# Patient Record
Sex: Male | Born: 1965
Health system: Southern US, Community
[De-identification: ages and names within clinical notes are randomized; demographics above are authoritative.]

## PROBLEM LIST (undated history)

## (undated) DIAGNOSIS — F909 Attention-deficit hyperactivity disorder, unspecified type: Secondary | ICD-10-CM

## (undated) DIAGNOSIS — IMO0001 Reserved for inherently not codable concepts without codable children: Secondary | ICD-10-CM

## (undated) DIAGNOSIS — E669 Obesity, unspecified: Secondary | ICD-10-CM

## (undated) DIAGNOSIS — E782 Mixed hyperlipidemia: Secondary | ICD-10-CM

## (undated) DIAGNOSIS — E1169 Type 2 diabetes mellitus with other specified complication: Secondary | ICD-10-CM

## (undated) DIAGNOSIS — R030 Elevated blood-pressure reading, without diagnosis of hypertension: Secondary | ICD-10-CM

## (undated) HISTORY — DX: Attention-deficit hyperactivity disorder, unspecified type: F90.9

## (undated) HISTORY — DX: Elevated blood-pressure reading, without diagnosis of hypertension: R03.0

## (undated) HISTORY — DX: Mixed hyperlipidemia: E78.2

## (undated) HISTORY — DX: Reserved for inherently not codable concepts without codable children: IMO0001

## (undated) HISTORY — DX: Obesity, unspecified: E11.69

## (undated) HISTORY — DX: Type 2 diabetes mellitus with other specified complication: E66.9

---

## 2000-07-18 ENCOUNTER — Emergency Department (HOSPITAL_COMMUNITY): Admission: EM | Admit: 2000-07-18 | Discharge: 2000-07-18 | Payer: Self-pay | Admitting: *Deleted

## 2000-07-18 ENCOUNTER — Encounter: Payer: Self-pay | Admitting: Emergency Medicine

## 2002-12-20 ENCOUNTER — Emergency Department (HOSPITAL_COMMUNITY): Admission: EM | Admit: 2002-12-20 | Discharge: 2002-12-21 | Payer: Self-pay | Admitting: Emergency Medicine

## 2002-12-21 ENCOUNTER — Encounter: Payer: Self-pay | Admitting: Emergency Medicine

## 2004-12-01 ENCOUNTER — Emergency Department (HOSPITAL_COMMUNITY): Admission: EM | Admit: 2004-12-01 | Discharge: 2004-12-01 | Payer: Self-pay | Admitting: Emergency Medicine

## 2010-10-09 ENCOUNTER — Emergency Department (HOSPITAL_BASED_OUTPATIENT_CLINIC_OR_DEPARTMENT_OTHER)
Admission: EM | Admit: 2010-10-09 | Discharge: 2010-10-09 | Disposition: A | Discharge status: Home / Self Care | Payer: Worker's Compensation | Attending: Emergency Medicine | Admitting: Emergency Medicine

## 2010-10-09 DIAGNOSIS — N509 Disorder of male genital organs, unspecified: Secondary | ICD-10-CM | POA: Insufficient documentation

## 2010-10-09 LAB — URINE MICROSCOPIC-ADD ON

## 2010-10-09 LAB — URINALYSIS, ROUTINE W REFLEX MICROSCOPIC
Bilirubin Urine: NEGATIVE
Glucose, UA: NEGATIVE mg/dL
Ketones, ur: NEGATIVE mg/dL
Leukocytes, UA: NEGATIVE
Nitrite: NEGATIVE
Protein, ur: NEGATIVE mg/dL
Specific Gravity, Urine: 1.022 (ref 1.005–1.030)
Urobilinogen, UA: 1 mg/dL (ref 0.0–1.0)
pH: 6 (ref 5.0–8.0)

## 2010-10-10 ENCOUNTER — Other Ambulatory Visit: Payer: Self-pay | Admitting: Surgery

## 2010-10-10 ENCOUNTER — Ambulatory Visit
Admission: RE | Admit: 2010-10-10 | Discharge: 2010-10-10 | Disposition: A | Discharge status: Home / Self Care | Payer: Worker's Compensation | Source: Ambulatory Visit | Attending: Surgery | Admitting: Surgery

## 2010-10-10 DIAGNOSIS — N50819 Testicular pain, unspecified: Secondary | ICD-10-CM

## 2011-08-21 ENCOUNTER — Ambulatory Visit
Admission: RE | Admit: 2011-08-21 | Discharge: 2011-08-21 | Disposition: A | Discharge status: Home / Self Care | Payer: 59 | Source: Ambulatory Visit | Attending: Family Medicine | Admitting: Family Medicine

## 2011-08-21 ENCOUNTER — Other Ambulatory Visit: Payer: Self-pay | Admitting: Family Medicine

## 2011-08-21 DIAGNOSIS — R1031 Right lower quadrant pain: Secondary | ICD-10-CM

## 2011-08-21 MED ORDER — IOHEXOL 300 MG/ML  SOLN
125.0000 mL | Freq: Once | INTRAMUSCULAR | Status: AC | PRN
  Administered 2011-08-21: 125 mL via INTRAVENOUS

## 2011-08-21 MED ORDER — IOHEXOL 300 MG/ML  SOLN
30.0000 mL | Freq: Once | INTRAMUSCULAR | Status: AC | PRN
  Administered 2011-08-21: 30 mL via ORAL

## 2012-11-16 IMAGING — CT CT ABD-PELV W/ CM
2 of 5 series · 13 of 32 positions shown, 18 images · IV contrast (omnipaque)
Comparison: None.

CLINICAL DATA: Severe right lower quadrant pain.

CT ABDOMEN AND PELVIS WITH CONTRAST
TECHNIQUE: Multidetector CT imaging of the abdomen and pelvis was
performed following the standard protocol during bolus
administration of intravenous contrast.
Contrast: 125mL OMNIPAQUE IOHEXOL 300 MG/ML IV SOLN, 30mL OMNIPAQUE
IOHEXOL 300 MG/ML IV SOLN

[Series 2: abd/pelvis with · axial · 0.86mm/px · z∈[-430,-85]mm · 5 of 105 slices shown, 10 images]
[im 18/105  soft-tissue]
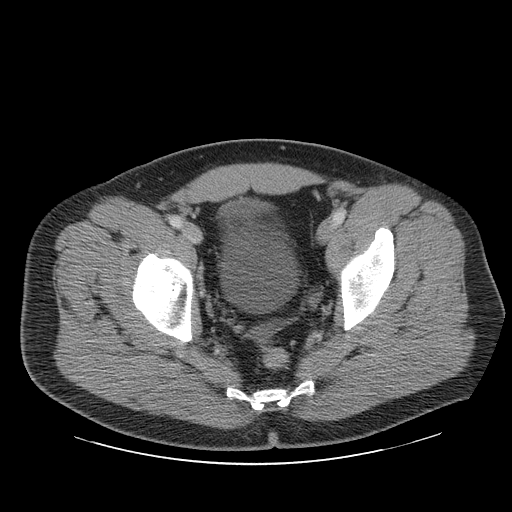
[im 18/105  bone]
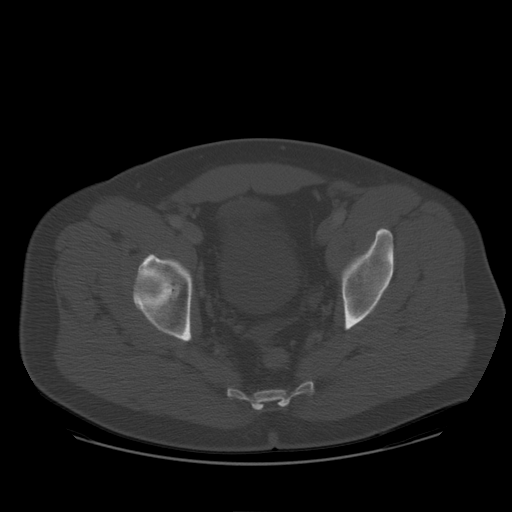
[im 35/105  soft-tissue]
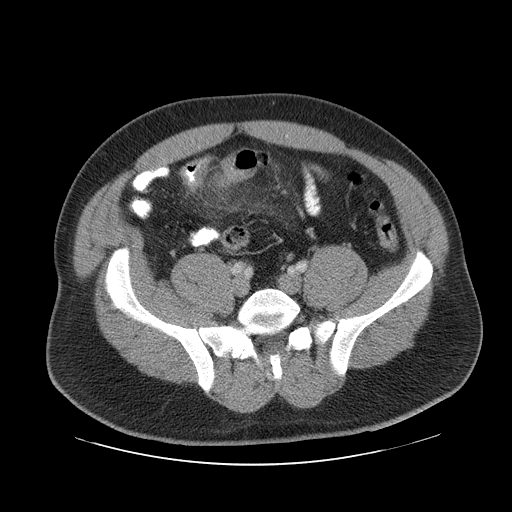
[im 35/105  lung]
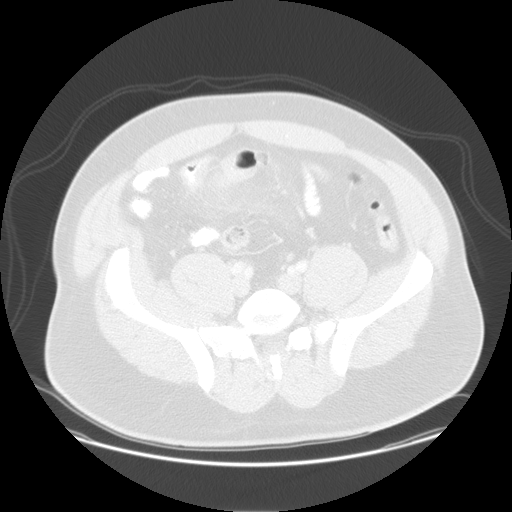
[im 53/105  soft-tissue]
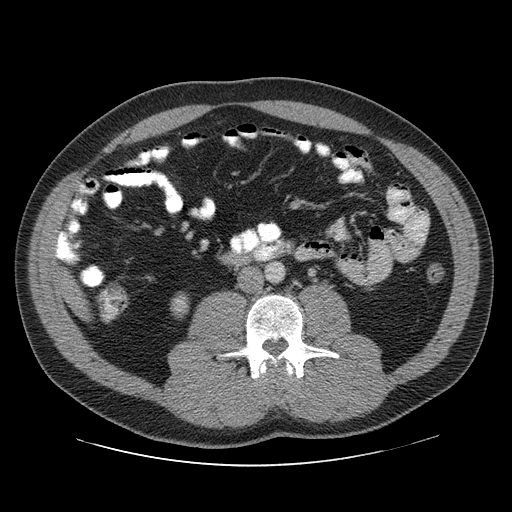
[im 53/105  lung]
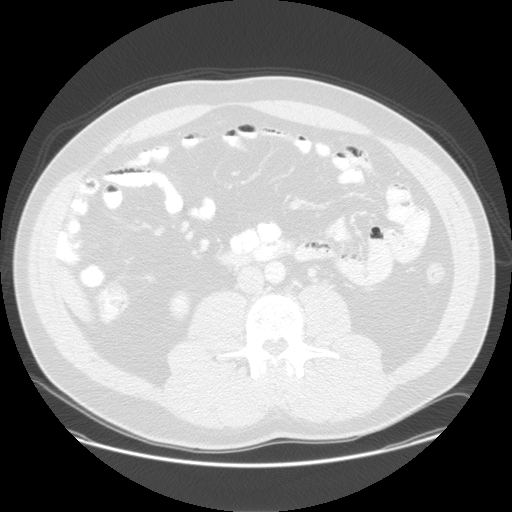
[im 70/105  soft-tissue]
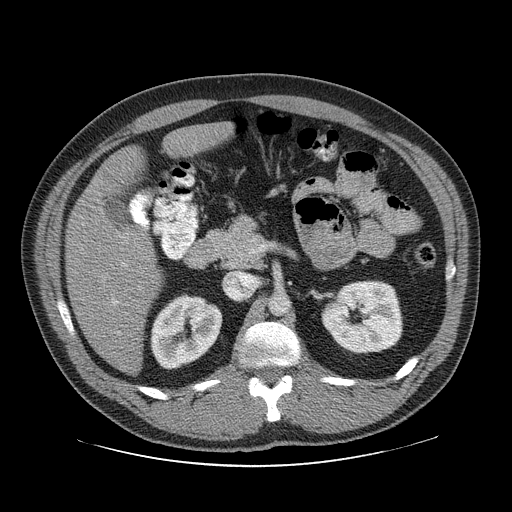
[im 70/105  lung]
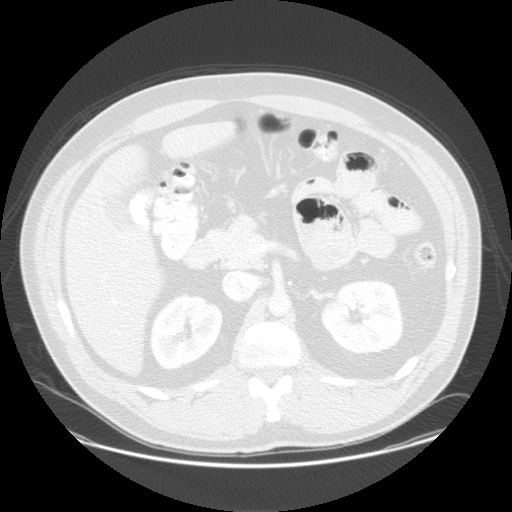
[im 87/105  soft-tissue]
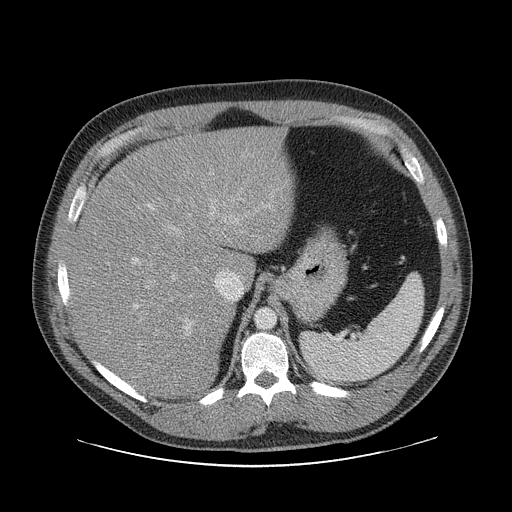
[im 87/105  lung]
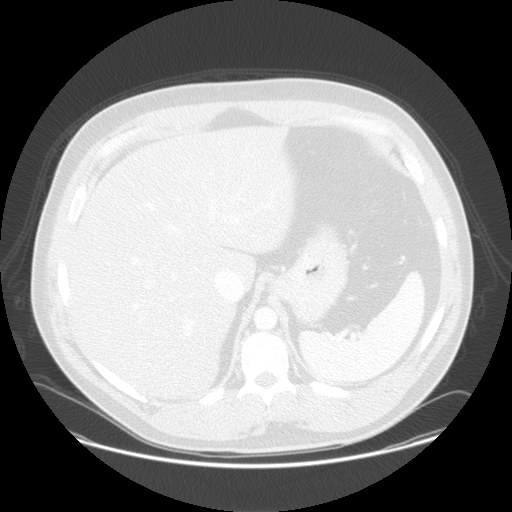

[Series 400: sag · sagittal · 1.09mm/px · 8 of 168 slices shown]
[im 16/168  soft-tissue]
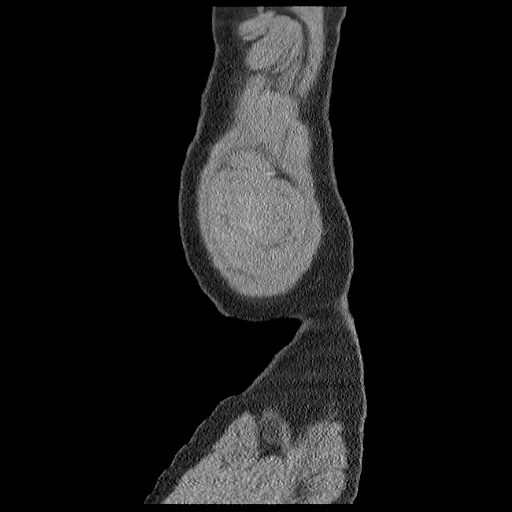
[im 31/168  soft-tissue]
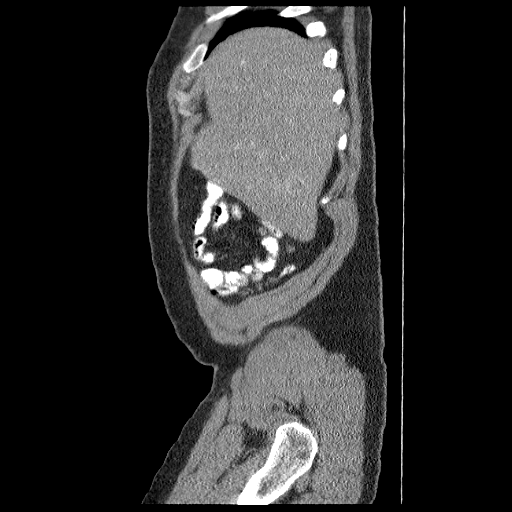
[im 61/168  soft-tissue]
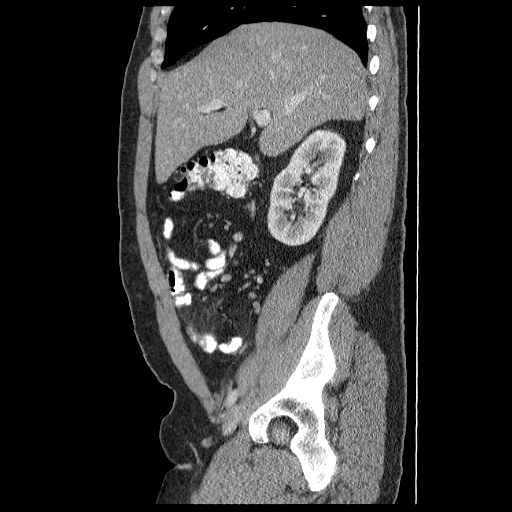
[im 76/168  soft-tissue]
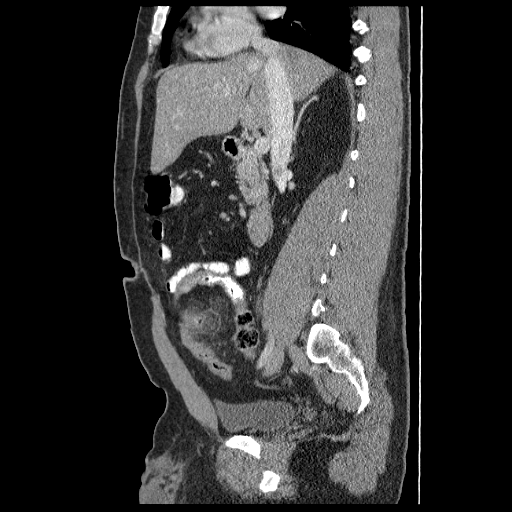
[im 92/168  soft-tissue]
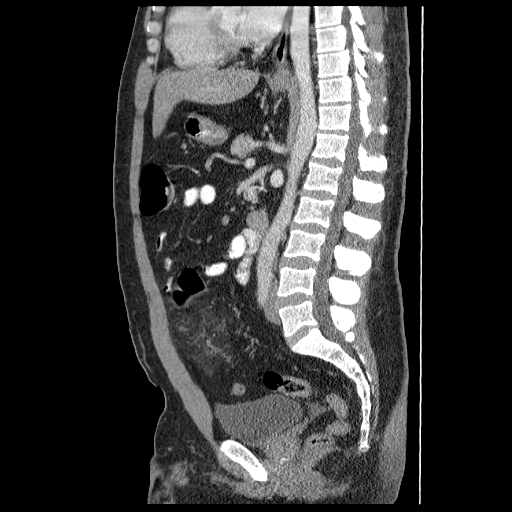
[im 107/168  soft-tissue]
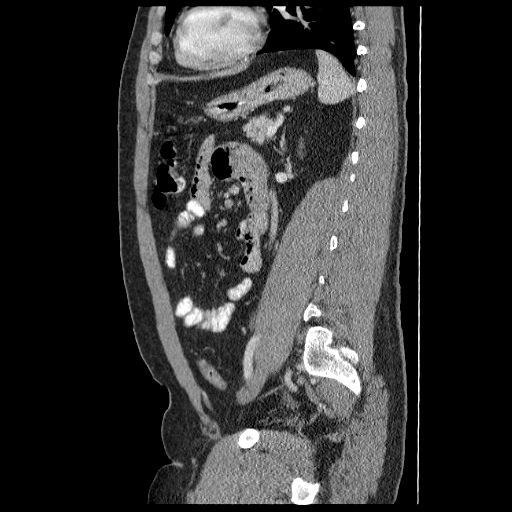
[im 137/168  soft-tissue]
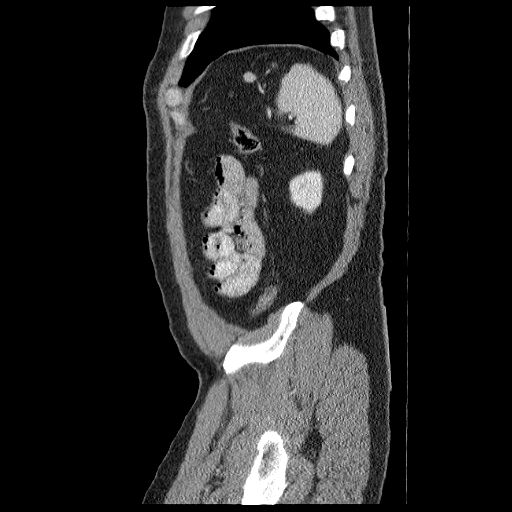
[im 152/168  soft-tissue]
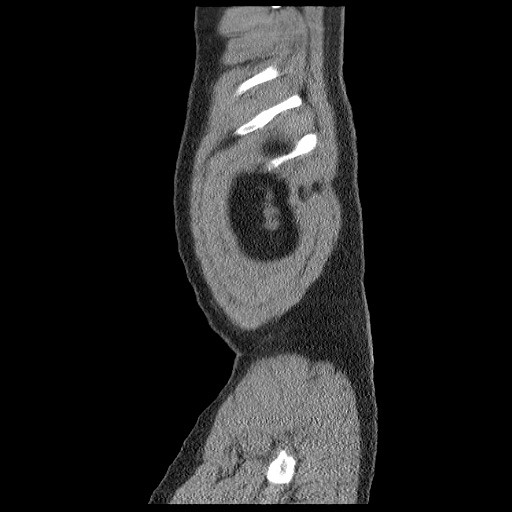

[13 of 32 positions shown; findings below may reference images not displayed]

FINDINGS: The liver is diffusely fatty infiltrated and measures
26.8 cm in cranial caudal length, enlarged.  7 mm hyperattenuating
lesion is seen in the upper right liver, too small to characterize.
Spleen is unremarkable.  The stomach, duodenum, pancreas,
gallbladder, adrenal glands, and right kidney have normal imaging
features. Tiny low density lesion in the left kidney is too small
to characterize but likely represents a cortical cyst.

No abdominal aortic aneurysm.  There is no free fluid or
lymphadenopathy in the abdomen.

Imaging through the pelvis shows a trace amount of intraperitoneal
free fluid.  There is no pelvic sidewall lymphadenopathy.  Bladder
is unremarkable.

The sigmoid colon is elongated and somewhat redundant pattern.
There is an area of edema and inflammation around the mid sigmoid
colon which is positioned in the anterior abdomen near the midline
just below the level of the umbilicus.  Imaging features are
consistent with diverticulitis without evidence for perforation or
abscess.

The terminal ileum is normal.  The appendix is normal.

Bone windows reveal no worrisome lytic or sclerotic osseous
lesions.
IMPRESSION: Diverticulitis of the mid sigmoid colon.  No perforation or abscess
at this time.

## 2013-12-29 ENCOUNTER — Ambulatory Visit: Payer: Self-pay | Admitting: Podiatrist

## 2014-04-13 LAB — PULMONARY FUNCTION TEST

## 2014-05-24 ENCOUNTER — Telehealth: Payer: Self-pay | Admitting: Cardiology

## 2014-05-24 NOTE — Telephone Encounter (Signed)
Records from [facility] received for appointment on 01.04.16 with P [place]: given to [place] in HIM department:djc

## 2014-07-23 ENCOUNTER — Ambulatory Visit (INDEPENDENT_AMBULATORY_CARE_PROVIDER_SITE_OTHER): Payer: 59 | Admitting: Cardiology

## 2014-07-23 ENCOUNTER — Encounter: Payer: Self-pay | Admitting: Cardiology

## 2014-07-23 VITALS — BP 160/82 | HR 84 | Ht 73.0 in | Wt 251.0 lb

## 2014-07-23 DIAGNOSIS — I10 Essential (primary) hypertension: Secondary | ICD-10-CM

## 2014-07-23 DIAGNOSIS — E782 Mixed hyperlipidemia: Secondary | ICD-10-CM | POA: Insufficient documentation

## 2014-07-23 DIAGNOSIS — E119 Type 2 diabetes mellitus without complications: Secondary | ICD-10-CM

## 2014-07-23 DIAGNOSIS — E669 Obesity, unspecified: Secondary | ICD-10-CM

## 2014-07-23 DIAGNOSIS — Z8249 Family history of ischemic heart disease and other diseases of the circulatory system: Secondary | ICD-10-CM | POA: Insufficient documentation

## 2014-07-23 DIAGNOSIS — E1169 Type 2 diabetes mellitus with other specified complication: Secondary | ICD-10-CM | POA: Insufficient documentation

## 2014-07-23 DIAGNOSIS — R9431 Abnormal electrocardiogram [ECG] [EKG]: Secondary | ICD-10-CM

## 2014-07-23 NOTE — Progress Notes (Signed)
Sriansh Farra Shreffler Date of Birth: [birth year] Medical Record #[id number]  History of Present Illness: Eric Young is seen at the request of Dr. Tiburcio Pea for cardiac evaluation. He is a pleasant 49 yo WM with multiple cardiac risk factors. He has DM type 2, elevated BP, Hyperlipidemia, and family history of premature CAD. He works as a Company secretary. Due to his DM cardiac evaluation is required to demonstrate no ischemia at a work load of 12 mets and to assess cardiac function. He has been trying to lose weight. He denies any current chest pain, SOB, dizziness or edema. He tries to stay active. He does report his BP readings have been running a little high at work- typically in the 140s systolic.     Medication List       This list is accurate as of: 07/23/14  9:07 PM.  Always use your most recent med list.               ADDERALL XR 20 MG 24 hr capsule  Generic drug:  amphetamine-dextroamphetamine  Take 40 mg by mouth daily. Take 2 tablets daily     INVOKANA 300 MG Tabs tablet  Generic drug:  canagliflozin  Take 300 mg by mouth daily.     metFORMIN 500 MG 24 hr tablet  Commonly known as:  GLUCOPHAGE-XR  Take 500 mg by mouth daily. Take 4 tablets daily         Past Medical History  Diagnosis Date  . Diabetes mellitus type 2 in obese   . ADHD (attention deficit hyperactivity disorder)   . Combined hyperlipidemia   . Elevated BP     History reviewed. No pertinent past surgical history.  History   Social History  . Marital Status: Married    Spouse Name: N/A    Number of Children: 3  . Years of Education: N/A   Occupational History  . fireman    Social History Main Topics  . Smoking status: Never Smoker   . Smokeless tobacco: None  . Alcohol Use: No  . Drug Use: No  . Sexual Activity: None   Other Topics Concern  . None   Social History Narrative    Family History  Problem Relation Age of Onset  . Hyperlipidemia Mother   . Heart attack Father   . Heart disease  Father   . Hypertension Father     Review of Systems: As noted in HPI.  All other systems were reviewed and are negative.  Physical Exam: BP 160/82 mmHg  Pulse 84  Ht  (1.854 m)  Wt 251 lb (113.853 kg)  BMI 33.12 kg/m2 Filed Weights   07/23/14 1009  Weight: 251 lb (113.853 kg)  GENERAL:  Well appearing obese WM in NAD. HEENT:  PERRL, EOMI, sclera are clear. Oropharynx is clear. NECK:  No jugular venous distention, carotid upstroke brisk and symmetric, no bruits, no thyromegaly or adenopathy LUNGS:  Clear to auscultation bilaterally CHEST:  Unremarkable HEART:  RRR,  PMI not displaced or sustained,S1 and S2 within normal limits, no S3, no S4: no clicks, no rubs, no murmurs ABD:  Soft, nontender. BS +, no masses or bruits. No hepatomegaly, no splenomegaly EXT:  2 + pulses throughout, no edema, no cyanosis no clubbing SKIN:  Warm and dry.  No rashes NEURO:  Alert and oriented x 3. Cranial nerves II through XII intact. PSYCH:  Cognitively intact    LABORATORY DATA: Ecg today: NSR with rate 84. LAFB. I have personally reviewed  and interpreted this study. Labs dated 03/31/14: normal CBC. Glucose 188. ALT 53. Other chemistries normal. Cholesterol 188, triglycerides 692. HDL 23. PSA and TSH normal.   Assessment / Plan: 1. DM type 2. On metformin. Reports last A1c 6.3%. Per Dr. Tiburcio Pea.  2. Elevated BP- with his diabetes goal BP less than 130/80. Monitor with lifestyle modifications but if still elevated would start antihypertensive therapy preferably with ACEi or ARB.  3. Dyslipidemia. Prior elevated triglycerides with poor diabetes control. Would reassess once glycemic control better. Add fish oil 2-4 grams per day. I would have a low threshold for starting statin therapy  4. Family history of premature CAD  5. LAFB  6. Obesity. Discussed importance of dietary modification with low carbohydrates particulaly avoiding sweets and sodas. Reducing white foods such as rice, pasta,  potatoes, and bread. Eat more complex plant foods and avoid processed foods. Increase aerobic activity.  Per NFPA guidelines will schedule for stress Echo to rule out ischemia and assess LV function.

## 2014-07-23 NOTE — Patient Instructions (Signed)
Goal blood pressure is less than 130/80 with your diabetes  Work on weight loss with carbohydrate modified diet- reducing simple carbohydrates (rice,pasta,breads,potatoes) and sweets. Eat more vegetables.   Consider taking 2-4 grams of fish oil daily  We will schedule you for a stress Echo.

## 2014-07-26 ENCOUNTER — Telehealth: Payer: Self-pay

## 2014-07-26 NOTE — Telephone Encounter (Signed)
Dr.Jordan's 07/23/14 office note faxed to [facility] Dr.Stephen Williams [place] at fax # [phone].

## 2014-07-27 ENCOUNTER — Ambulatory Visit (HOSPITAL_COMMUNITY): Payer: 59 | Attending: Cardiovascular Disease | Admitting: Cardiology

## 2014-07-27 ENCOUNTER — Ambulatory Visit (HOSPITAL_COMMUNITY): Payer: 59

## 2014-07-27 DIAGNOSIS — E669 Obesity, unspecified: Secondary | ICD-10-CM

## 2014-07-27 DIAGNOSIS — R9431 Abnormal electrocardiogram [ECG] [EKG]: Secondary | ICD-10-CM | POA: Insufficient documentation

## 2014-07-27 DIAGNOSIS — E1169 Type 2 diabetes mellitus with other specified complication: Secondary | ICD-10-CM

## 2014-07-27 DIAGNOSIS — I10 Essential (primary) hypertension: Secondary | ICD-10-CM | POA: Diagnosis not present

## 2014-07-27 DIAGNOSIS — Z8249 Family history of ischemic heart disease and other diseases of the circulatory system: Secondary | ICD-10-CM | POA: Diagnosis not present

## 2014-07-27 DIAGNOSIS — E782 Mixed hyperlipidemia: Secondary | ICD-10-CM

## 2014-07-27 NOTE — Progress Notes (Signed)
Stress echo performed. 

## 2014-07-30 ENCOUNTER — Telehealth: Payer: Self-pay

## 2014-07-30 NOTE — Telephone Encounter (Signed)
Spoke to patient he stated he needed a letter stating stress echo normal.Letter mailed 07/30/14.

## 2015-10-11 ENCOUNTER — Ambulatory Visit
Admission: RE | Admit: 2015-10-11 | Discharge: 2015-10-11 | Disposition: A | Discharge status: Home / Self Care | Payer: Commercial Managed Care - HMO | Source: Ambulatory Visit | Attending: Family Medicine | Admitting: Family Medicine

## 2015-10-11 ENCOUNTER — Other Ambulatory Visit: Payer: Self-pay | Admitting: Family Medicine

## 2015-10-11 DIAGNOSIS — M545 Low back pain, unspecified: Secondary | ICD-10-CM

## 2016-08-04 DIAGNOSIS — E119 Type 2 diabetes mellitus without complications: Secondary | ICD-10-CM | POA: Diagnosis not present

## 2016-08-04 DIAGNOSIS — H33199 Other retinoschisis and retinal cysts, unspecified eye: Secondary | ICD-10-CM | POA: Diagnosis not present

## 2016-08-04 DIAGNOSIS — H2513 Age-related nuclear cataract, bilateral: Secondary | ICD-10-CM | POA: Diagnosis not present

## 2016-08-31 DIAGNOSIS — E782 Mixed hyperlipidemia: Secondary | ICD-10-CM | POA: Diagnosis not present

## 2016-08-31 DIAGNOSIS — E781 Pure hyperglyceridemia: Secondary | ICD-10-CM | POA: Diagnosis not present

## 2016-11-03 DIAGNOSIS — E782 Mixed hyperlipidemia: Secondary | ICD-10-CM | POA: Diagnosis not present

## 2016-11-03 DIAGNOSIS — E119 Type 2 diabetes mellitus without complications: Secondary | ICD-10-CM | POA: Diagnosis not present

## 2017-01-06 IMAGING — CT CT ABD-PELV W/O CM
1 of 2 series · 15 of 32 positions shown, 19 images · non-contrast
Comparison: CT abdomen pelvis 08/21/2011

CLINICAL DATA: Right-sided low back pain without sciatica. Micro
hematuria. Rule out kidney stone

EXAM:
CT ABDOMEN AND PELVIS WITHOUT CONTRAST
TECHNIQUE: Multidetector CT imaging of the abdomen and pelvis was performed
following the standard protocol without IV contrast.

[Series 2: abd/pelvis w/(date) · axial · 0.84mm/px · z∈[-516,+14]mm · 15 of 116 slices shown, 19 images]
[im 5/116  soft-tissue]
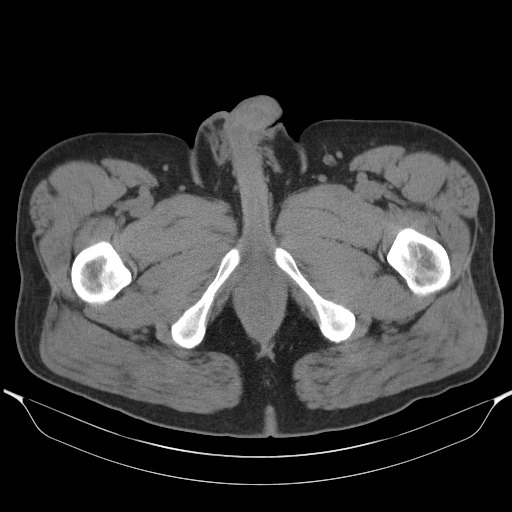
[im 5/116  bone]
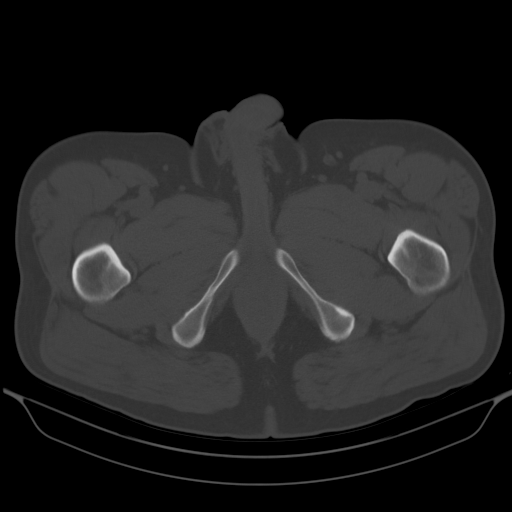
[im 14/116  soft-tissue]
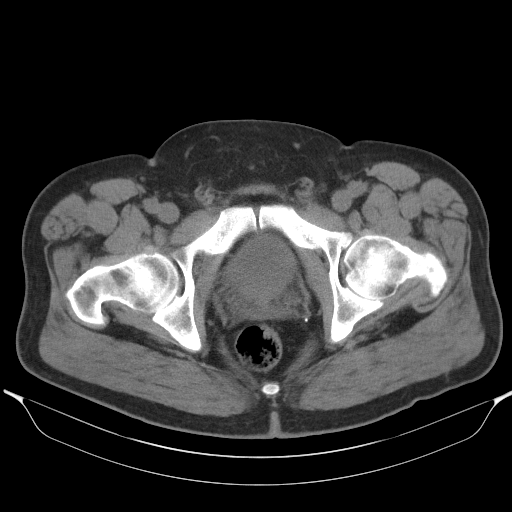
[im 23/116  soft-tissue]
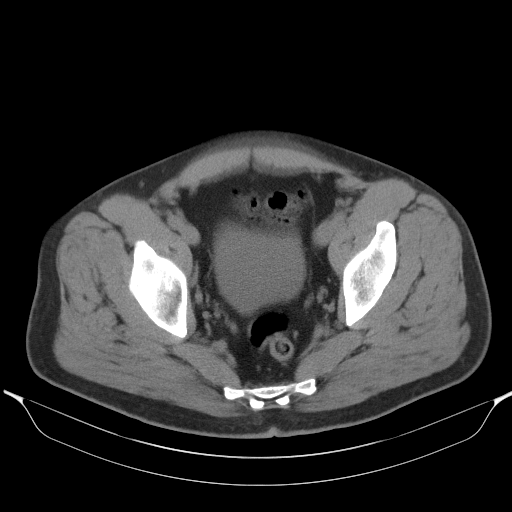
[im 31/116  soft-tissue]
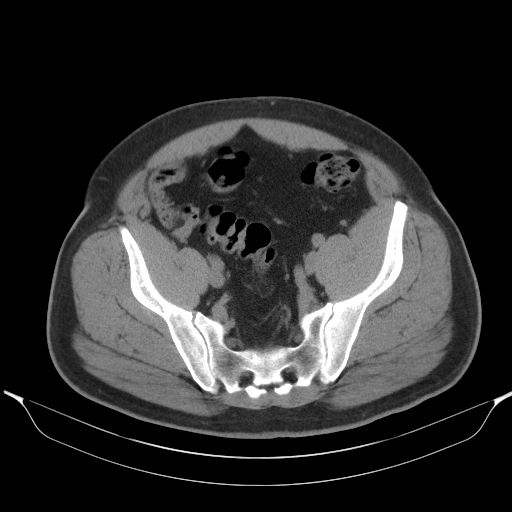
[im 40/116  soft-tissue]
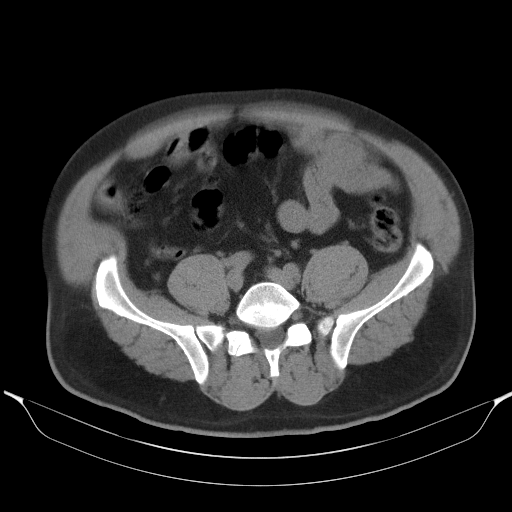
[im 49/116  soft-tissue]
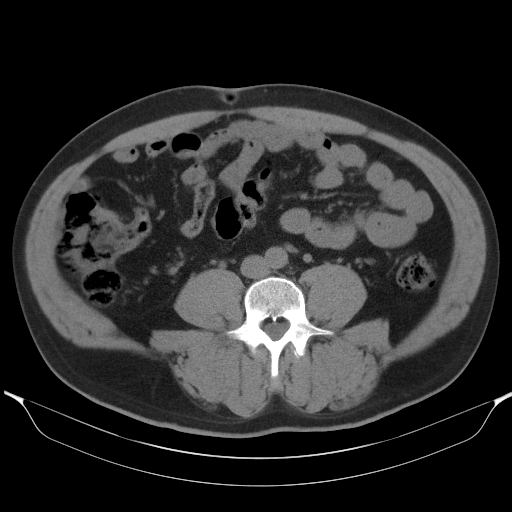
[im 58/116  soft-tissue]
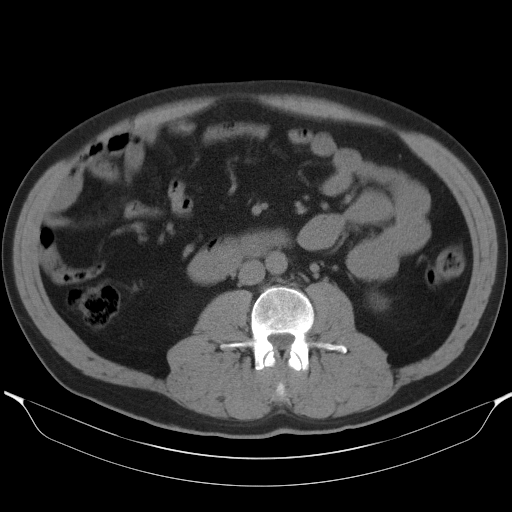
[im 67/116  soft-tissue]
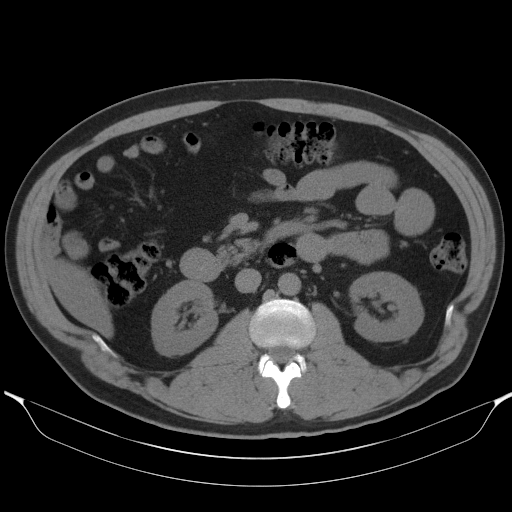
[im 76/116  soft-tissue]
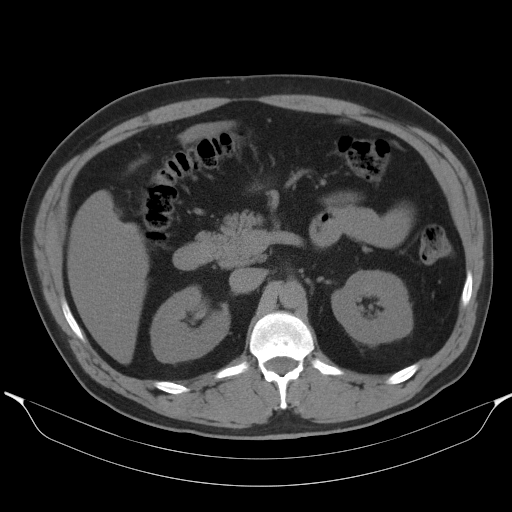
[im 76/116  bone]
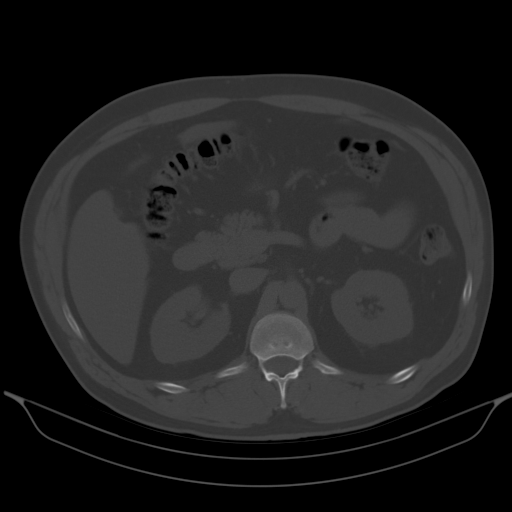
[im 85/116  soft-tissue]
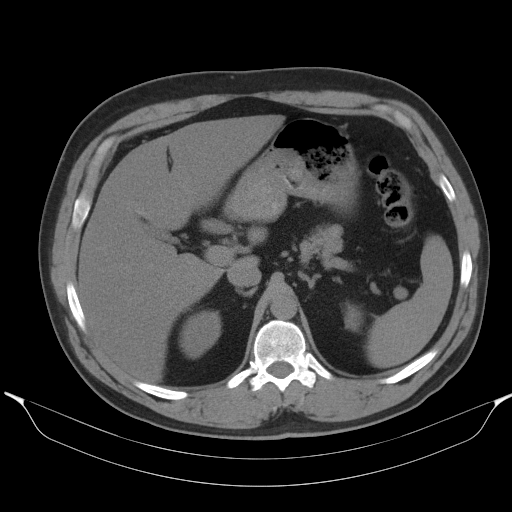
[im 93/116  soft-tissue]
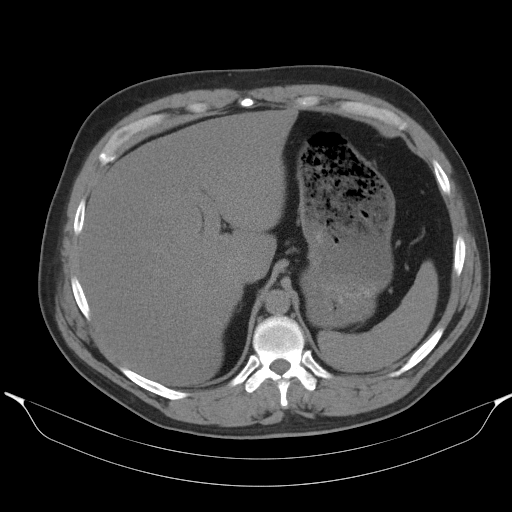
[im 98/116  lung]
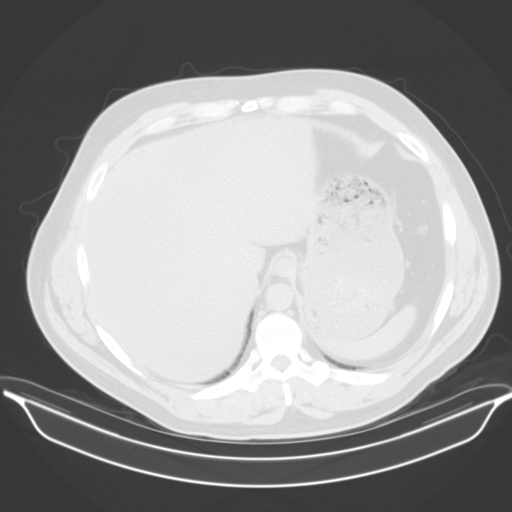
[im 102/116  soft-tissue]
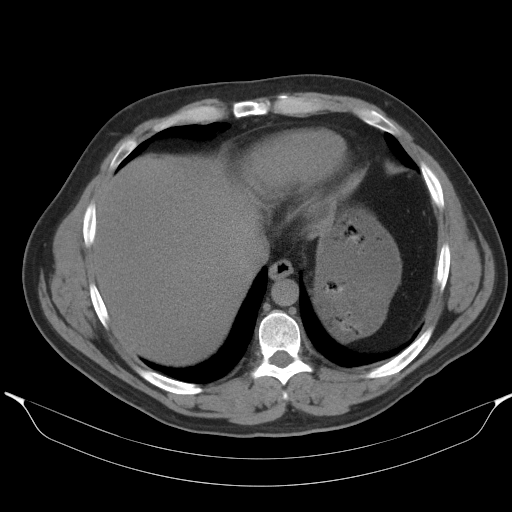
[im 102/116  lung]
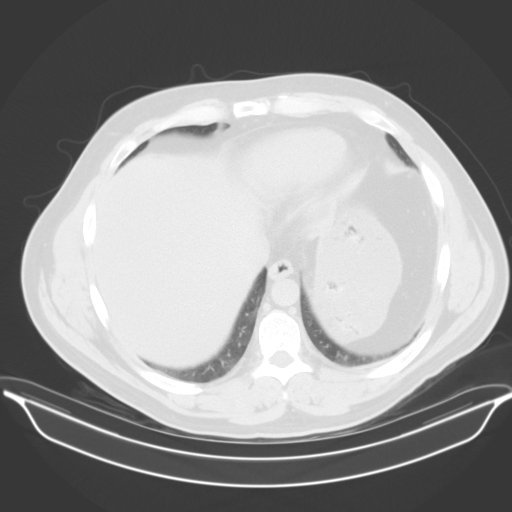
[im 107/116  lung]
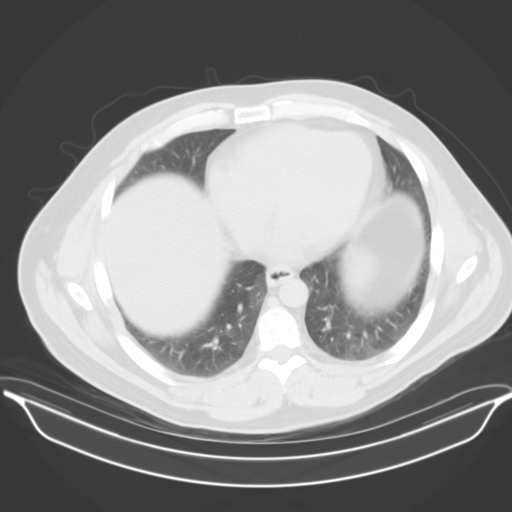
[im 111/116  soft-tissue]
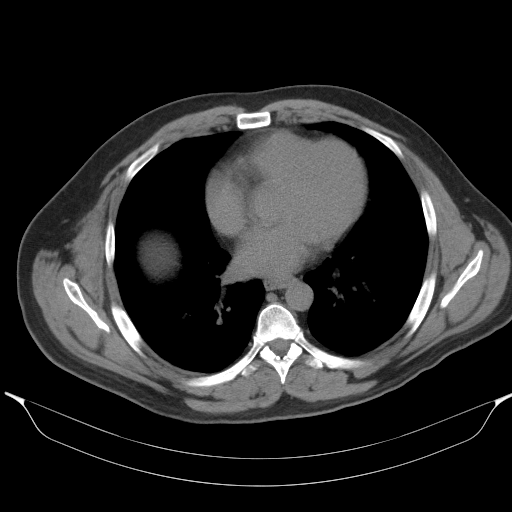
[im 111/116  lung]
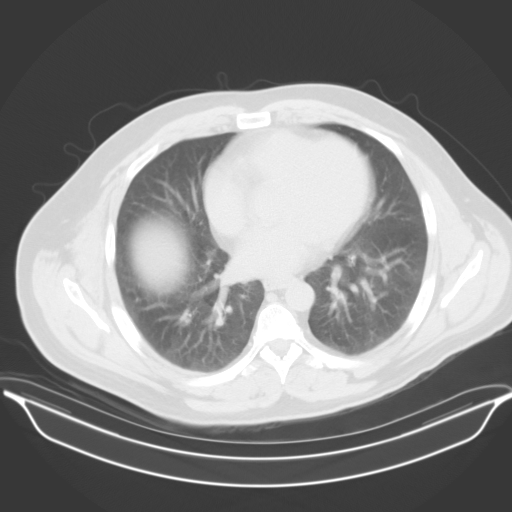

[15 of 32 positions shown; findings below may reference images not displayed]

FINDINGS: Lower chest:  Lung bases clear without infiltrate or effusion

Hepatobiliary: Fatty infiltration of the liver. No focal liver
lesion. Gallbladder and bile ducts normal.

Pancreas: Negative

Spleen: Spleen is normal in size. Scattered small soft tissue
nodules adjacent to the spleen are unchanged and most compatible
with splenules.

Adrenals/Urinary Tract: Negative for renal mass or obstruction. No
urinary tract calculi. Ureters are normal and the bladder is normal.

Stomach/Bowel: Negative for bowel obstruction. No bowel edema.
Sigmoid diverticulosis without diverticulitis. Normal appendix. Note
is made of diverticulitis of the sigmoid colon on the prior CT 5394

Vascular/Lymphatic: Negative for aortic aneurysm. No significant
atherosclerotic disease. No lymphadenopathy.

Reproductive: Moderate prostate enlargement with prostate
calcification.

Other: No free-fluid

Musculoskeletal: No significant skeletal abnormality.
IMPRESSION: No acute abnormality.  No urinary tract calculi

Sigmoid diverticulosis without diverticulitis

Normal appendix

## 2017-02-01 DIAGNOSIS — E119 Type 2 diabetes mellitus without complications: Secondary | ICD-10-CM | POA: Diagnosis not present

## 2017-02-01 DIAGNOSIS — E782 Mixed hyperlipidemia: Secondary | ICD-10-CM | POA: Diagnosis not present

## 2017-05-04 DIAGNOSIS — Z23 Encounter for immunization: Secondary | ICD-10-CM | POA: Diagnosis not present

## 2017-05-04 DIAGNOSIS — E119 Type 2 diabetes mellitus without complications: Secondary | ICD-10-CM | POA: Diagnosis not present

## 2017-05-04 DIAGNOSIS — E782 Mixed hyperlipidemia: Secondary | ICD-10-CM | POA: Diagnosis not present

## 2017-06-22 DIAGNOSIS — T63301A Toxic effect of unspecified spider venom, accidental (unintentional), initial encounter: Secondary | ICD-10-CM | POA: Diagnosis not present

## 2017-08-06 DIAGNOSIS — H33199 Other retinoschisis and retinal cysts, unspecified eye: Secondary | ICD-10-CM | POA: Diagnosis not present

## 2017-08-06 DIAGNOSIS — E119 Type 2 diabetes mellitus without complications: Secondary | ICD-10-CM | POA: Diagnosis not present

## 2017-08-06 DIAGNOSIS — H2513 Age-related nuclear cataract, bilateral: Secondary | ICD-10-CM | POA: Diagnosis not present

## 2017-10-28 DIAGNOSIS — E119 Type 2 diabetes mellitus without complications: Secondary | ICD-10-CM | POA: Diagnosis not present

## 2017-10-28 DIAGNOSIS — E782 Mixed hyperlipidemia: Secondary | ICD-10-CM | POA: Diagnosis not present

## 2018-05-04 DIAGNOSIS — E782 Mixed hyperlipidemia: Secondary | ICD-10-CM | POA: Diagnosis not present

## 2018-05-04 DIAGNOSIS — E119 Type 2 diabetes mellitus without complications: Secondary | ICD-10-CM | POA: Diagnosis not present

## 2018-05-04 DIAGNOSIS — Z23 Encounter for immunization: Secondary | ICD-10-CM | POA: Diagnosis not present

## 2018-05-31 DIAGNOSIS — H2513 Age-related nuclear cataract, bilateral: Secondary | ICD-10-CM | POA: Diagnosis not present

## 2018-05-31 DIAGNOSIS — E119 Type 2 diabetes mellitus without complications: Secondary | ICD-10-CM | POA: Diagnosis not present

## 2018-05-31 DIAGNOSIS — H33199 Other retinoschisis and retinal cysts, unspecified eye: Secondary | ICD-10-CM | POA: Diagnosis not present

## 2019-10-12 ENCOUNTER — Ambulatory Visit: Payer: 59 | Attending: Internal Medicine

## 2019-10-12 DIAGNOSIS — Z23 Encounter for immunization: Secondary | ICD-10-CM

## 2019-10-12 NOTE — Progress Notes (Signed)
   Covid-19 Vaccination Clinic  Name:  Eric Young    MRN: [id number] DOB: [birth year]  10/12/2019  Eric Young was observed post Covid-19 immunization for 15 minutes without incident. He was provided with Vaccine Information Sheet and instruction to access the V-Safe system.   Eric Young was instructed to call 911 with any severe reactions post vaccine: Marland Kitchen Difficulty breathing  . Swelling of face and throat  . A fast heartbeat  . A bad rash all over body  . Dizziness and weakness   Immunizations Administered    Name Date Dose VIS Date Route   Pfizer COVID-19 Vaccine 10/12/2019 11:58 AM 0.3 mL 06/30/2019 Intramuscular   Manufacturer: [employer], [employer]   Lot: [id number]   NDC: [address]-0

## 2019-11-06 ENCOUNTER — Ambulatory Visit: Payer: 59 | Attending: Internal Medicine

## 2019-11-06 DIAGNOSIS — Z23 Encounter for immunization: Secondary | ICD-10-CM

## 2019-11-06 NOTE — Progress Notes (Signed)
   Covid-19 Vaccination Clinic  Name:  DALESSANDRO BALDYGA    MRN: [id number] DOB: 07/20/1966  11/06/2019  Mr. Maker was observed post Covid-19 immunization for 15 minutes without incident. He was provided with Vaccine Information Sheet and instruction to access the V-Safe system.   Mr. Mohl was instructed to call 911 with any severe reactions post vaccine: Marland Kitchen Difficulty breathing  . Swelling of face and throat  . A fast heartbeat  . A bad rash all over body  . Dizziness and weakness   Immunizations Administered    Name Date Dose VIS Date Route   Pfizer COVID-19 Vaccine 11/06/2019 11:02 AM 0.3 mL 09/13/2018 Intramuscular   Manufacturer: [employer], [employer]   Lot: [id number]   NDC: [address]-1

## 2022-06-01 ENCOUNTER — Other Ambulatory Visit (HOSPITAL_COMMUNITY): Payer: Self-pay

## 2022-06-02 ENCOUNTER — Other Ambulatory Visit (HOSPITAL_COMMUNITY): Payer: Self-pay

## 2022-06-02 MED ORDER — OMEGA-3-ACID ETHYL ESTERS 1 G PO CAPS
2.0000 g | ORAL_CAPSULE | Freq: Two times a day (BID) | ORAL | 0 refills | Status: DC
  Filled 2022-06-02 – 2022-07-31 (×2): qty 360, 90d supply, fill #0

## 2022-06-02 MED ORDER — EMPAGLIFLOZIN 25 MG PO TABS
25.0000 mg | ORAL_TABLET | Freq: Every day | ORAL | 3 refills | Status: DC
  Filled 2022-06-02: qty 30, 30d supply, fill #0
  Filled 2022-06-26 – 2022-06-29 (×2): qty 30, 30d supply, fill #1
  Filled 2022-09-07: qty 30, 30d supply, fill #2
  Filled 2022-09-30 – 2022-11-05 (×2): qty 30, 30d supply, fill #3

## 2022-06-02 MED ORDER — AMPHETAMINE-DEXTROAMPHET ER 30 MG PO CP24
30.0000 mg | ORAL_CAPSULE | Freq: Every morning | ORAL | 0 refills | Status: AC
  Filled 2022-06-02: qty 30, 30d supply, fill #0

## 2022-06-02 MED ORDER — AMPHETAMINE-DEXTROAMPHET ER 30 MG PO CP24: 30.0000 mg | ORAL_CAPSULE | Freq: Every morning | ORAL | 0 refills | Status: DC

## 2022-06-02 MED ORDER — METFORMIN HCL ER 500 MG PO TB24
2000.0000 mg | ORAL_TABLET | Freq: Every day | ORAL | 1 refills | Status: DC
  Filled 2022-06-02: qty 120, 30d supply, fill #0
  Filled 2022-06-26 – 2022-06-29 (×2): qty 120, 30d supply, fill #1

## 2022-06-03 ENCOUNTER — Other Ambulatory Visit (HOSPITAL_COMMUNITY): Payer: Self-pay

## 2022-06-03 MED ORDER — AMPHETAMINE-DEXTROAMPHET ER 30 MG PO CP24
30.0000 mg | ORAL_CAPSULE | Freq: Every morning | ORAL | 0 refills | Status: AC
  Filled 2022-07-03: qty 30, 30d supply, fill #0

## 2022-06-03 MED ORDER — METFORMIN HCL ER 500 MG PO TB24
2000.0000 mg | ORAL_TABLET | Freq: Every day | ORAL | 3 refills | Status: AC
  Filled 2022-06-03 – 2022-09-07 (×3): qty 120, 30d supply, fill #0
  Filled 2022-11-05: qty 120, 30d supply, fill #1
  Filled 2022-11-29: qty 120, 30d supply, fill #2
  Filled 2023-01-06: qty 120, 30d supply, fill #3

## 2022-06-03 MED ORDER — TRULICITY 3 MG/0.5ML ~~LOC~~ SOAJ
3.0000 mg | SUBCUTANEOUS | 3 refills | Status: DC
  Filled 2022-06-03: qty 2, 28d supply, fill #0
  Filled 2022-06-26 – 2022-06-29 (×2): qty 2, 28d supply, fill #1
  Filled 2022-07-31: qty 2, 28d supply, fill #2
  Filled 2022-09-07: qty 2, 28d supply, fill #3

## 2022-06-03 MED ORDER — AMPHETAMINE-DEXTROAMPHET ER 30 MG PO CP24
30.0000 mg | ORAL_CAPSULE | Freq: Every morning | ORAL | 0 refills | Status: DC
  Filled 2022-08-05: qty 30, 30d supply, fill #0

## 2022-06-03 MED ORDER — ROSUVASTATIN CALCIUM 10 MG PO TABS
10.0000 mg | ORAL_TABLET | Freq: Every day | ORAL | 3 refills | Status: DC
  Filled 2022-06-03: qty 30, 30d supply, fill #0
  Filled 2022-06-26 – 2022-06-29 (×2): qty 30, 30d supply, fill #1
  Filled 2022-08-04: qty 30, 30d supply, fill #2
  Filled 2022-09-07: qty 30, 30d supply, fill #3

## 2022-06-03 MED ORDER — JARDIANCE 25 MG PO TABS
25.0000 mg | ORAL_TABLET | Freq: Every day | ORAL | 3 refills | Status: DC
  Filled 2022-06-03 – 2022-09-30 (×3): qty 30, 30d supply, fill #0
  Filled 2022-11-29: qty 30, 30d supply, fill #1
  Filled 2023-01-07 – 2023-01-29 (×2): qty 30, 30d supply, fill #2
  Filled 2023-03-03: qty 30, 30d supply, fill #3

## 2022-06-03 MED ORDER — OMEGA-3-ACID ETHYL ESTERS 1 G PO CAPS
2.0000 g | ORAL_CAPSULE | Freq: Two times a day (BID) | ORAL | 1 refills | Status: DC
  Filled 2022-06-03: qty 360, 90d supply, fill #0

## 2022-06-05 ENCOUNTER — Other Ambulatory Visit (HOSPITAL_COMMUNITY): Payer: Self-pay

## 2022-06-05 MED ORDER — FENOFIBRATE 160 MG PO TABS
160.0000 mg | ORAL_TABLET | Freq: Every day | ORAL | 5 refills | Status: DC
  Filled 2022-06-05: qty 30, 30d supply, fill #0
  Filled 2022-06-26 – 2022-07-01 (×2): qty 30, 30d supply, fill #1
  Filled 2022-08-05: qty 30, 30d supply, fill #2
  Filled 2022-09-07: qty 30, 30d supply, fill #3
  Filled 2022-09-30: qty 30, 30d supply, fill #4
  Filled 2022-11-03: qty 30, 30d supply, fill #5

## 2022-06-26 ENCOUNTER — Other Ambulatory Visit (HOSPITAL_COMMUNITY): Payer: Self-pay

## 2022-06-29 ENCOUNTER — Other Ambulatory Visit (HOSPITAL_COMMUNITY): Payer: Self-pay

## 2022-06-30 ENCOUNTER — Other Ambulatory Visit: Payer: Self-pay

## 2022-06-30 ENCOUNTER — Emergency Department (HOSPITAL_BASED_OUTPATIENT_CLINIC_OR_DEPARTMENT_OTHER): Payer: 59

## 2022-06-30 ENCOUNTER — Emergency Department (HOSPITAL_BASED_OUTPATIENT_CLINIC_OR_DEPARTMENT_OTHER)
Admission: EM | Admit: 2022-06-30 | Discharge: 2022-06-30 | Disposition: A | Discharge status: Home / Self Care | Payer: 59 | Attending: Emergency Medicine | Admitting: Emergency Medicine

## 2022-06-30 ENCOUNTER — Encounter (HOSPITAL_BASED_OUTPATIENT_CLINIC_OR_DEPARTMENT_OTHER): Payer: Self-pay | Admitting: Urology

## 2022-06-30 DIAGNOSIS — R109 Unspecified abdominal pain: Secondary | ICD-10-CM | POA: Insufficient documentation

## 2022-06-30 DIAGNOSIS — Z8616 Personal history of COVID-19: Secondary | ICD-10-CM | POA: Insufficient documentation

## 2022-06-30 LAB — COMPREHENSIVE METABOLIC PANEL
ALT: 19 U/L (ref 0–44)
AST: 17 U/L (ref 15–41)
Albumin: 4.2 g/dL (ref 3.5–5.0)
Alkaline Phosphatase: 52 U/L (ref 38–126)
Anion gap: 7 (ref 5–15)
BUN: 18 mg/dL (ref 6–20)
CO2: 24 mmol/L (ref 22–32)
Calcium: 9.4 mg/dL (ref 8.9–10.3)
Chloride: 107 mmol/L (ref 98–111)
Creatinine, Ser: 0.98 mg/dL (ref 0.61–1.24)
GFR, Estimated: >60 mL/min (ref >60)
Glucose, Bld: 235 mg/dL — ABNORMAL HIGH (ref 70–99)
Potassium: 3.5 mmol/L (ref 3.5–5.1)
Sodium: 138 mmol/L (ref 135–145)
Total Bilirubin: 0.8 mg/dL (ref 0.3–1.2)
Total Protein: 7.2 g/dL (ref 6.5–8.1)

## 2022-06-30 LAB — CBC WITH DIFFERENTIAL/PLATELET
Abs Immature Granulocytes: 0.05 10*3/uL (ref 0.00–0.07)
Basophils Absolute: 0 10*3/uL (ref 0.0–0.1)
Basophils Relative: 1 %
Eosinophils Absolute: 0.1 10*3/uL (ref 0.0–0.5)
Eosinophils Relative: 2 %
HCT: 43.2 % (ref 39.0–52.0)
Hemoglobin: 14.8 g/dL (ref 13.0–17.0)
Immature Granulocytes: 1 %
Lymphocytes Relative: 34 %
Lymphs Abs: 2 10*3/uL (ref 0.7–4.0)
MCH: 29.3 pg (ref 26.0–34.0)
MCHC: 34.3 g/dL (ref 30.0–36.0)
MCV: 85.5 fL (ref 80.0–100.0)
Monocytes Absolute: 0.5 10*3/uL (ref 0.1–1.0)
Monocytes Relative: 9 %
Neutro Abs: 3.1 10*3/uL (ref 1.7–7.7)
Neutrophils Relative %: 53 %
Platelets: 383 10*3/uL (ref 150–400)
RBC: 5.05 MIL/uL (ref 4.22–5.81)
RDW: 12.4 % (ref 11.5–15.5)
WBC: 5.7 10*3/uL (ref 4.0–10.5)
nRBC: 0 % (ref 0.0–0.2)

## 2022-06-30 LAB — URINALYSIS, MICROSCOPIC (REFLEX)

## 2022-06-30 LAB — URINALYSIS, ROUTINE W REFLEX MICROSCOPIC
Bilirubin Urine: NEGATIVE
Glucose, UA: >=500 mg/dL — AB
Ketones, ur: NEGATIVE mg/dL
Leukocytes,Ua: NEGATIVE
Nitrite: NEGATIVE
Protein, ur: NEGATIVE mg/dL
Specific Gravity, Urine: 1.015 (ref 1.005–1.030)
pH: 5 (ref 5.0–8.0)

## 2022-06-30 LAB — LIPASE, BLOOD: Lipase: 64 U/L — ABNORMAL HIGH (ref 11–51)

## 2022-06-30 MED ORDER — METHOCARBAMOL 500 MG PO TABS
500.0000 mg | ORAL_TABLET | Freq: Three times a day (TID) | ORAL | 0 refills | Status: AC | PRN
  Filled 2022-06-30: qty 20, 7d supply, fill #0

## 2022-06-30 MED ORDER — METHOCARBAMOL 500 MG PO TABS
500.0000 mg | ORAL_TABLET | Freq: Once | ORAL | Status: AC
  Administered 2022-06-30: 500 mg via ORAL
  Filled 2022-06-30: qty 1

## 2022-06-30 MED ORDER — KETOROLAC TROMETHAMINE 30 MG/ML IJ SOLN
30.0000 mg | Freq: Once | INTRAMUSCULAR | Status: AC
  Administered 2022-06-30: 30 mg via INTRAMUSCULAR
  Filled 2022-06-30: qty 1

## 2022-06-30 MED ORDER — NAPROXEN 500 MG PO TABS
500.0000 mg | ORAL_TABLET | Freq: Two times a day (BID) | ORAL | 0 refills | Status: AC | PRN
  Filled 2022-06-30: qty 30, 15d supply, fill #0

## 2022-06-30 NOTE — Discharge Instructions (Signed)
Suspect your pain is likely musculoskeletal.  No evidence of kidney stone or urinary tract infection.  Your CT scan does show some nonspecific inflammation of your intestines likely from viral infection.  You should have a repeat CT scan in about a month to make sure it is improving.  Return to the ED sooner with worsening pain, weakness, numbness, tingling, other concerns.

## 2022-06-30 NOTE — ED Provider Notes (Signed)
[facility] EMERGENCY DEPARTMENT Provider Note   CSN: [id number] Arrival date & time: 06/30/22  1849     History {Add pertinent medical, surgical, social history, OB history to HPI:1} Chief Complaint  Patient presents with   Flank Pain    Eric Young is a 56 y.o. male.  Left-sided low back and flank pain since about 9 AM.  No fall or injury but does work for EMS delivering equipment but denies any specific injury.  Took some ibuprofen without relief.  Pain is improving slowly.  No radiation of the pain down his buttocks or legs.  No pain with urination or blood in the urine.  No fever, chills, nausea or vomiting.  Has had some intermittent diarrhea for the past several days but recently had COVID.  Denies abdominal pain.  No chest pain or shortness of breath.  No history of back problems.  No previous back surgeries.  No history of kidney stones.  No history of IV drug abuse or cancer.  No fever.  No history of similar pain in the past. No testicular pain.  The history is provided by the patient.  Flank Pain Pertinent negatives include no chest pain, no abdominal pain, no headaches and no shortness of breath.       Home Medications Prior to Admission medications   Medication Sig Start Date End Date Taking? Authorizing Provider  ADDERALL XR 20 MG 24 hr capsule Take 40 mg by mouth daily. Take 2 tablets daily 07/16/14   [provider]  amphetamine-dextroamphetamine (ADDERALL XR) 30 MG 24 hr capsule Take 1 capsule (30 mg total) by mouth in the morning. 06/01/22     amphetamine-dextroamphetamine (ADDERALL XR) 30 MG 24 hr capsule Take 1 capsule (30 mg total) by mouth in the morning. (08-02-22)` 08/02/22     amphetamine-dextroamphetamine (ADDERALL XR) 30 MG 24 hr capsule Take 1 capsule (30 mg total) by mouth in the morning. (07-02-22) 07/02/22     Dulaglutide (TRULICITY) 3 MG/0.5ML SOPN Inject 3 mg into the skin once a week. 06/03/22     empagliflozin (JARDIANCE)  25 MG TABS tablet Take 1 tablet (25 mg total) by mouth daily. 04/21/22     empagliflozin (JARDIANCE) 25 MG TABS tablet Take 1 tablet (25 mg total) by mouth daily. 06/03/22     fenofibrate 160 MG tablet Take 1 tablet (160 mg total) by mouth daily. 06/04/22     INVOKANA 300 MG TABS tablet Take 300 mg by mouth daily.  07/16/14   [provider]  metFORMIN (GLUCOPHAGE-XR) 500 MG 24 hr tablet Take 500 mg by mouth daily. Take 4 tablets daily 07/16/14   [provider]  metFORMIN (GLUCOPHAGE-XR) 500 MG 24 hr tablet Take 4 tablets (2,000 mg total) by mouth daily. 05/29/22     metFORMIN (GLUCOPHAGE-XR) 500 MG 24 hr tablet Take 4 tablets (2,000 mg total) by mouth daily. 06/03/22     omega-3 acid ethyl esters (LOVAZA) 1 g capsule Take 2 capsules (2 g total) by mouth 2 (two) times daily. 03/25/22     rosuvastatin (CRESTOR) 10 MG tablet Take 1 tablet (10 mg total) by mouth daily. 06/03/22         Allergies    Patient has no known allergies.    Review of Systems   Review of Systems  Constitutional:  Negative for activity change, appetite change and fever.  HENT:  Negative for congestion and rhinorrhea.   Respiratory:  Negative for chest tightness and shortness of breath.  Cardiovascular:  Negative for chest pain.  Gastrointestinal:  Negative for abdominal pain, nausea and vomiting.  Genitourinary:  Positive for flank pain. Negative for dysuria and hematuria.  Musculoskeletal:  Positive for back pain. Negative for arthralgias and myalgias.  Skin:  Negative for rash.  Neurological:  Negative for weakness and headaches.    all other systems are negative except as noted in the HPI and PMH.   Physical Exam Updated Vital Signs BP (!) 149/90 (BP Location: Right Arm)   Pulse 79   Temp 97.8 F (36.6 C)   Resp 18   Ht 6\' 1"  (1.854 m)   Wt 106.1 kg   SpO2 99%   BMI 30.87 kg/m  Physical Exam Vitals and nursing note reviewed.  Constitutional:      General: He is not in acute  distress.    Appearance: He is well-developed.  HENT:     Head: Normocephalic and atraumatic.     Mouth/Throat:     Pharynx: No oropharyngeal exudate.  Eyes:     Conjunctiva/sclera: Conjunctivae normal.     Pupils: Pupils are equal, round, and reactive to light.  Neck:     Comments: No meningismus. Cardiovascular:     Rate and Rhythm: Normal rate and regular rhythm.     Heart sounds: Normal heart sounds. No murmur heard. Pulmonary:     Effort: Pulmonary effort is normal. No respiratory distress.     Breath sounds: Normal breath sounds.  Abdominal:     Palpations: Abdomen is soft.     Tenderness: There is no abdominal tenderness. There is no guarding or rebound.  Musculoskeletal:        General: Tenderness present. Normal range of motion.     Cervical back: Normal range of motion and neck supple.     Comments: Left CVA tenderness, no midline tenderness  5/5 strength in bilateral lower extremities. Ankle plantar and dorsiflexion intact. Great toe extension intact bilaterally. +2 DP and PT pulses. +2 patellar reflexes bilaterally. Normal gait.   Skin:    General: Skin is warm.  Neurological:     Mental Status: He is alert and oriented to person, place, and time.     Cranial Nerves: No cranial nerve deficit.     Motor: No abnormal muscle tone.     Coordination: Coordination normal.     Comments: No ataxia on finger to nose bilaterally. No pronator drift. 5/5 strength throughout. CN 2-12 intact.Equal grip strength. Sensation intact.   Psychiatric:        Behavior: Behavior normal.     ED Results / Procedures / Treatments   Labs (all labs ordered are listed, but only abnormal results are displayed) Labs Reviewed  URINALYSIS, ROUTINE W REFLEX MICROSCOPIC - Abnormal; Notable for the following components:      Result Value   Glucose, UA >=500 (*)    Hgb urine dipstick TRACE (*)    All other components within normal limits  URINALYSIS, MICROSCOPIC (REFLEX) - Abnormal; Notable  for the following components:   Bacteria, UA RARE (*)    All other components within normal limits  COMPREHENSIVE METABOLIC PANEL - Abnormal; Notable for the following components:   Glucose, Bld 235 (*)    All other components within normal limits  LIPASE, BLOOD - Abnormal; Notable for the following components:   Lipase 64 (*)    All other components within normal limits  CBC WITH DIFFERENTIAL/PLATELET    EKG None  Radiology CT Renal Stone Study  Result  Date: 06/30/2022 CLINICAL DATA:  Abdominal/flank pain EXAM: CT ABDOMEN AND PELVIS WITHOUT CONTRAST TECHNIQUE: Multidetector CT imaging of the abdomen and pelvis was performed following the standard protocol without IV contrast. RADIATION DOSE REDUCTION: This exam was performed according to the departmental dose-optimization program which includes automated exposure control, adjustment of the mA and/or kV according to patient size and/or use of iterative reconstruction technique. COMPARISON:  10/11/2015 FINDINGS: Lower chest: Borderline enlargement of the cardiopericardial silhouette. The lung bases appear clear. Hepatobiliary: Mildly contracted gallbladder. Otherwise unremarkable. Pancreas: Unremarkable Spleen: Unremarkable Adrenals/Urinary Tract: Stable homogeneous mildly hyperdense 0.7 cm lesion of the left mid kidney posteriorly on image 41 series 2, internal density 57 Hounsfield units. This lesion is indeterminate although stability from 2017 strongly favors a benign complex cyst. No further imaging workup of this lesion is indicated. No urinary tract calculi hydronephrosis. Urinary bladder unremarkable. Stomach/Bowel: 3.7 cm diverticulum of the transverse duodenum without findings of inflammation. Normal appendix. Sigmoid colon diverticulosis without findings of active diverticulitis. Thick-walled and mildly dilated appearance of the proximal jejunum and possibly the transverse duodenum, proximal enteritis is a distinct possibility.  Vascular/Lymphatic: Unremarkable Reproductive: Unremarkable Other: No supplemental non-categorized findings. Musculoskeletal: Unremarkable IMPRESSION: 1. Thick-walled and mildly dilated appearance of the proximal jejunum and possibly the transverse duodenum. This is a nonspecific appearance on today's noncontrast exam, but proximal enteritis would be a potential cause. No significant associated mesenteric stranding or ascites. If symptoms persist this might be further characterized with CT enterography or MRI enterography. 2. Sigmoid colon diverticulosis without findings of active diverticulitis. 3. Stable homogeneous mildly hyperdense 0.7 cm lesion of the left mid kidney posteriorly, stability from 2017 strongly favors a benign complex cyst. 4. Borderline enlargement of the cardiopericardial silhouette. Electronically Signed   By: Gaylyn Rong M.D.   On: 06/30/2022 20:22    Procedures Procedures  {Document cardiac monitor, telemetry assessment procedure when appropriate:1}  Medications Ordered in ED Medications  ketorolac (TORADOL) 30 MG/ML injection 30 mg (has no administration in time range)  methocarbamol (ROBAXIN) tablet 500 mg (has no administration in time range)    ED Course/ Medical Decision Making/ A&P                           Medical Decision Making Amount and/or Complexity of Data Reviewed Labs: ordered. Decision-making details documented in ED Course. Radiology: ordered and independent interpretation performed. Decision-making details documented in ED Course. ECG/medicine tests: ordered and independent interpretation performed. Decision-making details documented in ED Course.  Risk Prescription drug management.  Left flank and low back pain since 9 AM.  Does lift equipment for EMS.  Intact distal strength, sensation, pulses and reflexes.  No bowel or bladder incontinence.  Low suspicion for cord compression or cauda equina.  Urinalysis is negative.  Consider passed  kidney stone.  CT scan obtained in triage.  Kidney stone but does show some evidence of enteritis.   {Document critical care time when appropriate:1} {Document review of labs and clinical decision tools ie heart score, Chads2Vasc2 etc:1}  {Document your independent review of radiology images, and any outside records:1} {Document your discussion with family members, caretakers, and with consultants:1} {Document social determinants of health affecting pt's care:1} {Document your decision making why or why not admission, treatments were needed:1} Final Clinical Impression(s) / ED Diagnoses Final diagnoses:  None    Rx / DC Orders ED Discharge Orders     None

## 2022-06-30 NOTE — ED Triage Notes (Signed)
Left sided flank pain that started today at 0900 Had ibuprofen at 1500 Denies urinary symptoms

## 2022-07-01 ENCOUNTER — Other Ambulatory Visit (HOSPITAL_COMMUNITY): Payer: Self-pay

## 2022-07-02 ENCOUNTER — Other Ambulatory Visit (HOSPITAL_COMMUNITY): Payer: Self-pay

## 2022-07-03 ENCOUNTER — Other Ambulatory Visit (HOSPITAL_COMMUNITY): Payer: Self-pay

## 2022-07-14 ENCOUNTER — Other Ambulatory Visit (HOSPITAL_COMMUNITY): Payer: Self-pay

## 2022-07-24 ENCOUNTER — Other Ambulatory Visit (HOSPITAL_COMMUNITY): Payer: Self-pay | Admitting: Emergency Medicine

## 2022-07-27 ENCOUNTER — Other Ambulatory Visit (HOSPITAL_COMMUNITY): Payer: Self-pay

## 2022-07-31 ENCOUNTER — Other Ambulatory Visit (HOSPITAL_COMMUNITY): Payer: Self-pay

## 2022-07-31 DIAGNOSIS — E291 Testicular hypofunction: Secondary | ICD-10-CM | POA: Diagnosis not present

## 2022-08-04 ENCOUNTER — Other Ambulatory Visit (HOSPITAL_COMMUNITY): Payer: Self-pay

## 2022-08-05 ENCOUNTER — Other Ambulatory Visit: Payer: Self-pay

## 2022-08-05 ENCOUNTER — Other Ambulatory Visit (HOSPITAL_COMMUNITY): Payer: Self-pay

## 2022-08-07 ENCOUNTER — Other Ambulatory Visit (HOSPITAL_COMMUNITY): Payer: Self-pay

## 2022-08-07 MED ORDER — METFORMIN HCL ER 500 MG PO TB24
2000.0000 mg | ORAL_TABLET | Freq: Every day | ORAL | 1 refills | Status: DC
  Filled 2022-08-07: qty 120, 30d supply, fill #0
  Filled 2022-09-30: qty 120, 30d supply, fill #1

## 2022-08-10 DIAGNOSIS — E119 Type 2 diabetes mellitus without complications: Secondary | ICD-10-CM | POA: Diagnosis not present

## 2022-08-10 DIAGNOSIS — E782 Mixed hyperlipidemia: Secondary | ICD-10-CM | POA: Diagnosis not present

## 2022-08-10 DIAGNOSIS — E291 Testicular hypofunction: Secondary | ICD-10-CM | POA: Diagnosis not present

## 2022-08-10 DIAGNOSIS — F900 Attention-deficit hyperactivity disorder, predominantly inattentive type: Secondary | ICD-10-CM | POA: Diagnosis not present

## 2022-08-29 ENCOUNTER — Other Ambulatory Visit (HOSPITAL_COMMUNITY): Payer: Self-pay

## 2022-08-31 DIAGNOSIS — E291 Testicular hypofunction: Secondary | ICD-10-CM | POA: Diagnosis not present

## 2022-09-04 ENCOUNTER — Other Ambulatory Visit (HOSPITAL_COMMUNITY): Payer: Self-pay

## 2022-09-04 MED ORDER — AMPHETAMINE-DEXTROAMPHET ER 30 MG PO CP24
30.0000 mg | ORAL_CAPSULE | Freq: Every morning | ORAL | 0 refills | Status: AC
  Filled 2022-10-05: qty 30, 30d supply, fill #0

## 2022-09-04 MED ORDER — AMPHETAMINE-DEXTROAMPHET ER 30 MG PO CP24
30.0000 mg | ORAL_CAPSULE | Freq: Every morning | ORAL | 0 refills | Status: DC
  Filled 2022-11-29 – 2023-02-04 (×3): qty 30, 30d supply, fill #0

## 2022-09-04 MED ORDER — AMPHETAMINE-DEXTROAMPHET ER 30 MG PO CP24
30.0000 mg | ORAL_CAPSULE | Freq: Every morning | ORAL | 0 refills | Status: AC
  Filled 2022-09-04 – 2022-09-07 (×3): qty 30, 30d supply, fill #0

## 2022-09-07 ENCOUNTER — Other Ambulatory Visit (HOSPITAL_COMMUNITY): Payer: Self-pay

## 2022-09-07 ENCOUNTER — Other Ambulatory Visit: Payer: Self-pay

## 2022-09-07 MED ORDER — AMPHETAMINE-DEXTROAMPHET ER 30 MG PO CP24
30.0000 mg | ORAL_CAPSULE | Freq: Every morning | ORAL | 0 refills | Status: DC
  Filled 2022-10-05 – 2022-11-05 (×2): qty 30, 30d supply, fill #0

## 2022-09-28 DIAGNOSIS — E291 Testicular hypofunction: Secondary | ICD-10-CM | POA: Diagnosis not present

## 2022-09-30 ENCOUNTER — Other Ambulatory Visit (HOSPITAL_COMMUNITY): Payer: Self-pay

## 2022-10-01 ENCOUNTER — Other Ambulatory Visit: Payer: Self-pay

## 2022-10-02 ENCOUNTER — Other Ambulatory Visit (HOSPITAL_COMMUNITY): Payer: Self-pay

## 2022-10-02 MED ORDER — TRULICITY 3 MG/0.5ML ~~LOC~~ SOAJ
3.0000 mg | SUBCUTANEOUS | 1 refills | Status: DC
  Filled 2022-10-02: qty 2, 28d supply, fill #0
  Filled 2022-10-28: qty 2, 28d supply, fill #1

## 2022-10-02 MED ORDER — ROSUVASTATIN CALCIUM 10 MG PO TABS
10.0000 mg | ORAL_TABLET | Freq: Every day | ORAL | 1 refills | Status: DC
  Filled 2022-10-02: qty 30, 30d supply, fill #0
  Filled 2022-10-28: qty 30, 30d supply, fill #1

## 2022-10-05 ENCOUNTER — Other Ambulatory Visit (HOSPITAL_COMMUNITY): Payer: Self-pay

## 2022-10-22 ENCOUNTER — Other Ambulatory Visit (HOSPITAL_COMMUNITY): Payer: Self-pay

## 2022-10-22 DIAGNOSIS — E119 Type 2 diabetes mellitus without complications: Secondary | ICD-10-CM | POA: Diagnosis not present

## 2022-10-22 DIAGNOSIS — E291 Testicular hypofunction: Secondary | ICD-10-CM | POA: Diagnosis not present

## 2022-10-22 DIAGNOSIS — F900 Attention-deficit hyperactivity disorder, predominantly inattentive type: Secondary | ICD-10-CM | POA: Diagnosis not present

## 2022-10-22 DIAGNOSIS — E782 Mixed hyperlipidemia: Secondary | ICD-10-CM | POA: Diagnosis not present

## 2022-10-22 DIAGNOSIS — Z125 Encounter for screening for malignant neoplasm of prostate: Secondary | ICD-10-CM | POA: Diagnosis not present

## 2022-10-22 DIAGNOSIS — N5201 Erectile dysfunction due to arterial insufficiency: Secondary | ICD-10-CM | POA: Diagnosis not present

## 2022-10-22 MED ORDER — TADALAFIL 20 MG PO TABS
20.0000 mg | ORAL_TABLET | Freq: Every day | ORAL | 5 refills | Status: AC | PRN
  Filled 2022-10-22: qty 15, 30d supply, fill #0
  Filled 2022-10-28 – 2023-03-31 (×3): qty 15, 30d supply, fill #1
  Filled 2023-09-04: qty 6, 30d supply, fill #2

## 2022-10-28 ENCOUNTER — Other Ambulatory Visit (HOSPITAL_COMMUNITY): Payer: Self-pay

## 2022-10-28 ENCOUNTER — Other Ambulatory Visit: Payer: Self-pay

## 2022-10-29 ENCOUNTER — Other Ambulatory Visit (HOSPITAL_COMMUNITY): Payer: Self-pay

## 2022-10-30 ENCOUNTER — Other Ambulatory Visit (HOSPITAL_COMMUNITY): Payer: Self-pay

## 2022-10-30 MED ORDER — OMEGA-3-ACID ETHYL ESTERS 1 G PO CAPS
2.0000 | ORAL_CAPSULE | Freq: Two times a day (BID) | ORAL | 11 refills | Status: DC
  Filled 2022-10-30: qty 120, 30d supply, fill #0
  Filled 2022-10-30: qty 360, 90d supply, fill #0
  Filled 2023-01-06 – 2023-01-29 (×3): qty 360, 90d supply, fill #1
  Filled 2023-03-31 – 2023-05-01 (×2): qty 360, 90d supply, fill #2
  Filled 2023-07-31: qty 360, 90d supply, fill #3

## 2022-11-05 ENCOUNTER — Other Ambulatory Visit (HOSPITAL_COMMUNITY): Payer: Self-pay

## 2022-11-05 ENCOUNTER — Other Ambulatory Visit: Payer: Self-pay

## 2022-11-24 ENCOUNTER — Other Ambulatory Visit (HOSPITAL_COMMUNITY): Payer: Self-pay

## 2022-11-24 MED ORDER — TRULICITY 3 MG/0.5ML ~~LOC~~ SOAJ
3.0000 mg | SUBCUTANEOUS | 3 refills | Status: DC
  Filled 2022-11-24: qty 2, 28d supply, fill #0
  Filled 2022-12-23: qty 2, 28d supply, fill #1
  Filled 2023-01-06 – 2023-03-14 (×4): qty 2, 28d supply, fill #2

## 2022-11-26 ENCOUNTER — Other Ambulatory Visit (HOSPITAL_COMMUNITY): Payer: Self-pay

## 2022-11-29 ENCOUNTER — Other Ambulatory Visit (HOSPITAL_COMMUNITY): Payer: Self-pay

## 2022-11-30 ENCOUNTER — Other Ambulatory Visit: Payer: Self-pay

## 2022-11-30 ENCOUNTER — Other Ambulatory Visit (HOSPITAL_COMMUNITY): Payer: Self-pay

## 2022-11-30 MED ORDER — FENOFIBRATE 160 MG PO TABS
160.0000 mg | ORAL_TABLET | Freq: Every day | ORAL | 2 refills | Status: DC
  Filled 2022-11-30: qty 30, 30d supply, fill #0
  Filled 2023-01-07: qty 30, 30d supply, fill #1
  Filled 2023-01-29: qty 30, 30d supply, fill #2

## 2022-11-30 MED ORDER — ROSUVASTATIN CALCIUM 10 MG PO TABS
10.0000 mg | ORAL_TABLET | Freq: Every day | ORAL | 1 refills | Status: DC
  Filled 2022-11-30: qty 30, 30d supply, fill #0
  Filled 2022-12-07 – 2023-01-06 (×2): qty 30, 30d supply, fill #1
  Filled ????-??-??: fill #1

## 2022-12-02 DIAGNOSIS — E291 Testicular hypofunction: Secondary | ICD-10-CM | POA: Diagnosis not present

## 2022-12-07 ENCOUNTER — Other Ambulatory Visit (HOSPITAL_COMMUNITY): Payer: Self-pay

## 2022-12-07 MED ORDER — AMPHETAMINE-DEXTROAMPHET ER 30 MG PO CP24
30.0000 mg | ORAL_CAPSULE | Freq: Every morning | ORAL | 0 refills | Status: AC
  Filled 2022-12-07: qty 30, 30d supply, fill #0

## 2022-12-08 ENCOUNTER — Other Ambulatory Visit: Payer: Self-pay

## 2022-12-10 ENCOUNTER — Other Ambulatory Visit (HOSPITAL_COMMUNITY): Payer: Self-pay

## 2022-12-24 ENCOUNTER — Other Ambulatory Visit (HOSPITAL_COMMUNITY): Payer: Self-pay

## 2023-01-04 DIAGNOSIS — E291 Testicular hypofunction: Secondary | ICD-10-CM | POA: Diagnosis not present

## 2023-01-06 ENCOUNTER — Other Ambulatory Visit: Payer: Self-pay

## 2023-01-06 ENCOUNTER — Other Ambulatory Visit (HOSPITAL_COMMUNITY): Payer: Self-pay

## 2023-01-06 MED ORDER — AMPHETAMINE-DEXTROAMPHET ER 30 MG PO CP24
30.0000 mg | ORAL_CAPSULE | Freq: Every morning | ORAL | 0 refills | Status: AC
  Filled 2023-01-06: qty 30, 30d supply, fill #0

## 2023-01-06 MED ORDER — JARDIANCE 25 MG PO TABS
25.0000 mg | ORAL_TABLET | Freq: Every day | ORAL | 1 refills | Status: DC
  Filled 2023-01-06: qty 30, 30d supply, fill #0
  Filled 2023-01-29 – 2023-03-31 (×2): qty 30, 30d supply, fill #1

## 2023-01-07 ENCOUNTER — Other Ambulatory Visit (HOSPITAL_COMMUNITY): Payer: Self-pay

## 2023-01-12 ENCOUNTER — Other Ambulatory Visit (HOSPITAL_COMMUNITY): Payer: Self-pay

## 2023-01-22 ENCOUNTER — Other Ambulatory Visit (HOSPITAL_COMMUNITY): Payer: Self-pay

## 2023-01-25 ENCOUNTER — Other Ambulatory Visit (HOSPITAL_COMMUNITY): Payer: Self-pay

## 2023-01-25 MED ORDER — TRULICITY 4.5 MG/0.5ML ~~LOC~~ SOAJ
4.5000 mg | SUBCUTANEOUS | 3 refills | Status: AC
  Filled 2023-01-25: qty 6, 84d supply, fill #0
  Filled 2023-01-25: qty 2, 28d supply, fill #0
  Filled 2023-02-14: qty 2, 28d supply, fill #1
  Filled 2023-03-14 – 2023-04-15 (×2): qty 2, 28d supply, fill #2
  Filled 2023-05-19: qty 2, 28d supply, fill #3
  Filled 2023-06-14: qty 2, 28d supply, fill #4
  Filled 2023-07-07: qty 2, 28d supply, fill #5
  Filled 2023-07-31: qty 2, 28d supply, fill #6
  Filled 2023-09-04: qty 2, 28d supply, fill #7
  Filled 2023-10-04: qty 2, 28d supply, fill #8
  Filled 2023-10-31: qty 2, 28d supply, fill #9
  Filled ????-??-??: fill #8

## 2023-01-29 ENCOUNTER — Other Ambulatory Visit (HOSPITAL_COMMUNITY): Payer: Self-pay

## 2023-02-01 ENCOUNTER — Other Ambulatory Visit: Payer: Self-pay

## 2023-02-01 ENCOUNTER — Other Ambulatory Visit (HOSPITAL_COMMUNITY): Payer: Self-pay

## 2023-02-01 MED ORDER — ROSUVASTATIN CALCIUM 10 MG PO TABS
10.0000 mg | ORAL_TABLET | Freq: Every day | ORAL | 3 refills | Status: DC
  Filled 2023-02-01: qty 90, 90d supply, fill #0
  Filled 2023-03-31 – 2023-04-15 (×2): qty 90, 90d supply, fill #1
  Filled 2023-08-04: qty 90, 90d supply, fill #2
  Filled 2023-10-31: qty 90, 90d supply, fill #3
  Filled ????-??-??: fill #1

## 2023-02-01 MED ORDER — METFORMIN HCL ER 500 MG PO TB24
2000.0000 mg | ORAL_TABLET | Freq: Every day | ORAL | 0 refills | Status: DC
  Filled 2023-02-01: qty 120, 30d supply, fill #0

## 2023-02-04 ENCOUNTER — Other Ambulatory Visit (HOSPITAL_COMMUNITY): Payer: Self-pay

## 2023-02-09 DIAGNOSIS — E291 Testicular hypofunction: Secondary | ICD-10-CM | POA: Diagnosis not present

## 2023-02-09 DIAGNOSIS — E119 Type 2 diabetes mellitus without complications: Secondary | ICD-10-CM | POA: Diagnosis not present

## 2023-02-15 ENCOUNTER — Other Ambulatory Visit (HOSPITAL_COMMUNITY): Payer: Self-pay

## 2023-02-16 ENCOUNTER — Other Ambulatory Visit (HOSPITAL_COMMUNITY): Payer: Self-pay

## 2023-02-16 DIAGNOSIS — E782 Mixed hyperlipidemia: Secondary | ICD-10-CM | POA: Diagnosis not present

## 2023-02-16 DIAGNOSIS — N5201 Erectile dysfunction due to arterial insufficiency: Secondary | ICD-10-CM | POA: Diagnosis not present

## 2023-02-16 DIAGNOSIS — E119 Type 2 diabetes mellitus without complications: Secondary | ICD-10-CM | POA: Diagnosis not present

## 2023-02-16 DIAGNOSIS — F900 Attention-deficit hyperactivity disorder, predominantly inattentive type: Secondary | ICD-10-CM | POA: Diagnosis not present

## 2023-02-16 DIAGNOSIS — E291 Testicular hypofunction: Secondary | ICD-10-CM | POA: Diagnosis not present

## 2023-02-16 MED ORDER — SILDENAFIL CITRATE 100 MG PO TABS
100.0000 mg | ORAL_TABLET | Freq: Every day | ORAL | 3 refills | Status: AC | PRN
  Filled 2023-02-16: qty 6, 30d supply, fill #0
  Filled 2023-06-14: qty 6, 30d supply, fill #1
  Filled 2023-09-04: qty 6, 30d supply, fill #2

## 2023-02-16 MED ORDER — AMPHETAMINE-DEXTROAMPHET ER 30 MG PO CP24
30.0000 mg | ORAL_CAPSULE | Freq: Every day | ORAL | 0 refills | Status: AC
  Filled 2023-02-16: qty 30, 30d supply, fill #0
  Filled ????-??-??: fill #0

## 2023-03-03 ENCOUNTER — Other Ambulatory Visit (HOSPITAL_COMMUNITY): Payer: Self-pay

## 2023-03-03 ENCOUNTER — Other Ambulatory Visit: Payer: Self-pay

## 2023-03-03 MED ORDER — FENOFIBRATE 160 MG PO TABS
160.0000 mg | ORAL_TABLET | Freq: Every day | ORAL | 2 refills | Status: DC
  Filled 2023-03-03: qty 30, 30d supply, fill #0
  Filled 2023-03-31: qty 30, 30d supply, fill #1
  Filled 2023-05-19: qty 30, 30d supply, fill #2

## 2023-03-05 ENCOUNTER — Other Ambulatory Visit (HOSPITAL_COMMUNITY): Payer: Self-pay

## 2023-03-08 ENCOUNTER — Other Ambulatory Visit: Payer: Self-pay

## 2023-03-08 ENCOUNTER — Other Ambulatory Visit (HOSPITAL_COMMUNITY): Payer: Self-pay

## 2023-03-08 MED ORDER — METFORMIN HCL ER 500 MG PO TB24
2000.0000 mg | ORAL_TABLET | Freq: Every day | ORAL | 1 refills | Status: DC
  Filled 2023-03-08: qty 120, 30d supply, fill #0
  Filled 2023-04-08: qty 120, 30d supply, fill #1

## 2023-03-09 ENCOUNTER — Other Ambulatory Visit (HOSPITAL_COMMUNITY): Payer: Self-pay

## 2023-03-11 DIAGNOSIS — E291 Testicular hypofunction: Secondary | ICD-10-CM | POA: Diagnosis not present

## 2023-03-15 ENCOUNTER — Other Ambulatory Visit (HOSPITAL_COMMUNITY): Payer: Self-pay

## 2023-03-16 ENCOUNTER — Other Ambulatory Visit: Payer: Self-pay

## 2023-03-31 ENCOUNTER — Other Ambulatory Visit (HOSPITAL_COMMUNITY): Payer: Self-pay

## 2023-04-01 ENCOUNTER — Other Ambulatory Visit (HOSPITAL_COMMUNITY): Payer: Self-pay

## 2023-04-01 ENCOUNTER — Other Ambulatory Visit: Payer: Self-pay

## 2023-04-01 MED ORDER — JARDIANCE 25 MG PO TABS
25.0000 mg | ORAL_TABLET | Freq: Every day | ORAL | 1 refills | Status: AC
  Filled 2023-04-01 – 2023-05-01 (×2): qty 30, 30d supply, fill #0
  Filled 2023-06-01: qty 30, 30d supply, fill #1

## 2023-04-07 ENCOUNTER — Other Ambulatory Visit (HOSPITAL_COMMUNITY): Payer: Self-pay

## 2023-04-07 MED ORDER — AMPHETAMINE-DEXTROAMPHET ER 30 MG PO CP24
30.0000 mg | ORAL_CAPSULE | Freq: Every morning | ORAL | 0 refills | Status: DC
Start: 2023-04-07 — End: 2023-05-03
  Filled 2023-04-07: qty 30, 30d supply, fill #0

## 2023-04-08 ENCOUNTER — Other Ambulatory Visit (HOSPITAL_COMMUNITY): Payer: Self-pay

## 2023-04-08 ENCOUNTER — Encounter (HOSPITAL_COMMUNITY): Payer: Self-pay

## 2023-04-13 DIAGNOSIS — E291 Testicular hypofunction: Secondary | ICD-10-CM | POA: Diagnosis not present

## 2023-04-15 ENCOUNTER — Other Ambulatory Visit (HOSPITAL_COMMUNITY): Payer: Self-pay

## 2023-04-15 ENCOUNTER — Other Ambulatory Visit: Payer: Self-pay

## 2023-05-01 ENCOUNTER — Other Ambulatory Visit (HOSPITAL_COMMUNITY): Payer: Self-pay

## 2023-05-03 ENCOUNTER — Other Ambulatory Visit: Payer: Self-pay

## 2023-05-03 ENCOUNTER — Other Ambulatory Visit (HOSPITAL_COMMUNITY): Payer: Self-pay

## 2023-05-03 MED ORDER — METFORMIN HCL ER 500 MG PO TB24
2000.0000 mg | ORAL_TABLET | Freq: Every day | ORAL | 0 refills | Status: DC
  Filled 2023-05-03: qty 120, 30d supply, fill #0

## 2023-05-03 MED ORDER — AMPHETAMINE-DEXTROAMPHET ER 30 MG PO CP24
30.0000 mg | ORAL_CAPSULE | Freq: Every morning | ORAL | 0 refills | Status: DC
Start: 2023-05-03 — End: 2023-06-02
  Filled 2023-05-03 – 2023-05-05 (×2): qty 30, 30d supply, fill #0

## 2023-05-04 ENCOUNTER — Other Ambulatory Visit (HOSPITAL_COMMUNITY): Payer: Self-pay

## 2023-05-05 ENCOUNTER — Other Ambulatory Visit (HOSPITAL_COMMUNITY): Payer: Self-pay

## 2023-05-20 DIAGNOSIS — E291 Testicular hypofunction: Secondary | ICD-10-CM | POA: Diagnosis not present

## 2023-06-01 ENCOUNTER — Other Ambulatory Visit: Payer: Self-pay

## 2023-06-01 ENCOUNTER — Other Ambulatory Visit (HOSPITAL_COMMUNITY): Payer: Self-pay

## 2023-06-01 MED ORDER — AMOXICILLIN 500 MG PO CAPS
500.0000 mg | ORAL_CAPSULE | Freq: Three times a day (TID) | ORAL | 0 refills | Status: DC
  Filled 2023-06-01: qty 21, 7d supply, fill #0

## 2023-06-01 MED ORDER — METFORMIN HCL ER 500 MG PO TB24
2000.0000 mg | ORAL_TABLET | Freq: Every day | ORAL | 0 refills | Status: DC
  Filled 2023-06-01: qty 120, 30d supply, fill #0

## 2023-06-01 MED ORDER — FENOFIBRATE 160 MG PO TABS
160.0000 mg | ORAL_TABLET | Freq: Every day | ORAL | 1 refills | Status: DC
  Filled 2023-06-01 – 2023-06-14 (×2): qty 30, 30d supply, fill #0
  Filled 2023-07-16: qty 30, 30d supply, fill #1

## 2023-06-02 ENCOUNTER — Other Ambulatory Visit (HOSPITAL_COMMUNITY): Payer: Self-pay

## 2023-06-02 MED ORDER — AMPHETAMINE-DEXTROAMPHET ER 30 MG PO CP24
30.0000 mg | ORAL_CAPSULE | Freq: Every morning | ORAL | 0 refills | Status: DC
Start: 2023-06-02 — End: 2023-07-01
  Filled 2023-06-02 (×2): qty 30, 30d supply, fill #0

## 2023-06-07 ENCOUNTER — Other Ambulatory Visit (HOSPITAL_COMMUNITY): Payer: Self-pay

## 2023-06-15 ENCOUNTER — Other Ambulatory Visit (HOSPITAL_COMMUNITY): Payer: Self-pay

## 2023-06-16 DIAGNOSIS — E291 Testicular hypofunction: Secondary | ICD-10-CM | POA: Diagnosis not present

## 2023-07-01 ENCOUNTER — Other Ambulatory Visit (HOSPITAL_COMMUNITY): Payer: Self-pay

## 2023-07-01 DIAGNOSIS — E782 Mixed hyperlipidemia: Secondary | ICD-10-CM | POA: Diagnosis not present

## 2023-07-01 DIAGNOSIS — F900 Attention-deficit hyperactivity disorder, predominantly inattentive type: Secondary | ICD-10-CM | POA: Diagnosis not present

## 2023-07-01 DIAGNOSIS — K219 Gastro-esophageal reflux disease without esophagitis: Secondary | ICD-10-CM | POA: Diagnosis not present

## 2023-07-01 DIAGNOSIS — E291 Testicular hypofunction: Secondary | ICD-10-CM | POA: Diagnosis not present

## 2023-07-01 DIAGNOSIS — N5201 Erectile dysfunction due to arterial insufficiency: Secondary | ICD-10-CM | POA: Diagnosis not present

## 2023-07-01 DIAGNOSIS — E119 Type 2 diabetes mellitus without complications: Secondary | ICD-10-CM | POA: Diagnosis not present

## 2023-07-01 MED ORDER — AMPHETAMINE-DEXTROAMPHET ER 30 MG PO CP24
30.0000 mg | ORAL_CAPSULE | Freq: Every morning | ORAL | 0 refills | Status: AC
Start: 2023-07-01 — End: ?
  Filled 2023-07-01: qty 30, 30d supply, fill #0

## 2023-07-05 ENCOUNTER — Other Ambulatory Visit (HOSPITAL_COMMUNITY): Payer: Self-pay

## 2023-07-07 ENCOUNTER — Other Ambulatory Visit (HOSPITAL_COMMUNITY): Payer: Self-pay

## 2023-07-07 MED ORDER — METFORMIN HCL ER 500 MG PO TB24
2000.0000 mg | ORAL_TABLET | Freq: Every day | ORAL | 0 refills | Status: DC
  Filled 2023-07-07: qty 120, 30d supply, fill #0

## 2023-07-07 MED ORDER — JARDIANCE 25 MG PO TABS
25.0000 mg | ORAL_TABLET | Freq: Every day | ORAL | 0 refills | Status: DC
  Filled 2023-07-07: qty 30, 30d supply, fill #0

## 2023-07-08 ENCOUNTER — Other Ambulatory Visit (HOSPITAL_COMMUNITY): Payer: Self-pay

## 2023-07-08 DIAGNOSIS — Z860101 Personal history of adenomatous and serrated colon polyps: Secondary | ICD-10-CM | POA: Diagnosis not present

## 2023-07-08 DIAGNOSIS — K219 Gastro-esophageal reflux disease without esophagitis: Secondary | ICD-10-CM | POA: Diagnosis not present

## 2023-07-08 DIAGNOSIS — R1310 Dysphagia, unspecified: Secondary | ICD-10-CM | POA: Diagnosis not present

## 2023-07-08 MED ORDER — SUTAB 1479-225-188 MG PO TABS
ORAL_TABLET | ORAL | 0 refills | Status: AC
  Filled 2023-07-08: qty 24, 1d supply, fill #0

## 2023-07-09 ENCOUNTER — Other Ambulatory Visit (HOSPITAL_COMMUNITY): Payer: Self-pay

## 2023-07-19 ENCOUNTER — Other Ambulatory Visit (HOSPITAL_COMMUNITY): Payer: Self-pay

## 2023-07-19 MED ORDER — AMOXICILLIN 500 MG PO CAPS
500.0000 mg | ORAL_CAPSULE | Freq: Three times a day (TID) | ORAL | 0 refills | Status: AC
  Filled 2023-07-19: qty 21, 7d supply, fill #0

## 2023-07-22 DIAGNOSIS — E291 Testicular hypofunction: Secondary | ICD-10-CM | POA: Diagnosis not present

## 2023-07-25 ENCOUNTER — Other Ambulatory Visit (HOSPITAL_COMMUNITY): Payer: Self-pay

## 2023-07-26 ENCOUNTER — Other Ambulatory Visit (HOSPITAL_COMMUNITY): Payer: Self-pay

## 2023-07-26 MED ORDER — FENOFIBRATE 160 MG PO TABS
160.0000 mg | ORAL_TABLET | Freq: Every day | ORAL | 1 refills | Status: DC
  Filled 2023-07-31 – 2023-08-09 (×3): qty 30, 30d supply, fill #0
  Filled 2023-09-14: qty 30, 30d supply, fill #1

## 2023-07-26 MED ORDER — JARDIANCE 25 MG PO TABS
25.0000 mg | ORAL_TABLET | Freq: Every day | ORAL | 1 refills | Status: DC
  Filled 2023-07-31: qty 30, 30d supply, fill #0
  Filled 2023-09-04: qty 30, 30d supply, fill #1

## 2023-07-27 ENCOUNTER — Other Ambulatory Visit (HOSPITAL_COMMUNITY): Payer: Self-pay

## 2023-07-29 DIAGNOSIS — E291 Testicular hypofunction: Secondary | ICD-10-CM | POA: Diagnosis not present

## 2023-07-31 ENCOUNTER — Other Ambulatory Visit (HOSPITAL_COMMUNITY): Payer: Self-pay

## 2023-08-02 ENCOUNTER — Other Ambulatory Visit: Payer: Self-pay

## 2023-08-02 ENCOUNTER — Other Ambulatory Visit (HOSPITAL_COMMUNITY): Payer: Self-pay

## 2023-08-02 MED ORDER — METFORMIN HCL ER 500 MG PO TB24
2000.0000 mg | ORAL_TABLET | Freq: Every day | ORAL | 3 refills | Status: DC
  Filled 2023-08-02: qty 120, 30d supply, fill #0
  Filled 2023-09-04: qty 120, 30d supply, fill #1
  Filled 2023-10-04: qty 120, 30d supply, fill #2
  Filled 2023-10-31: qty 120, 30d supply, fill #3
  Filled ????-??-??: fill #2

## 2023-08-04 ENCOUNTER — Other Ambulatory Visit (HOSPITAL_COMMUNITY): Payer: Self-pay

## 2023-08-04 ENCOUNTER — Other Ambulatory Visit: Payer: Self-pay

## 2023-08-04 MED ORDER — AMPHETAMINE-DEXTROAMPHET ER 20 MG PO CP24
40.0000 mg | ORAL_CAPSULE | Freq: Every morning | ORAL | 0 refills | Status: AC
  Filled 2023-08-04: qty 60, 30d supply, fill #0

## 2023-08-05 ENCOUNTER — Other Ambulatory Visit (HOSPITAL_COMMUNITY): Payer: Self-pay

## 2023-08-06 ENCOUNTER — Other Ambulatory Visit (HOSPITAL_COMMUNITY): Payer: Self-pay

## 2023-08-09 ENCOUNTER — Other Ambulatory Visit: Payer: Self-pay

## 2023-08-09 ENCOUNTER — Other Ambulatory Visit (HOSPITAL_COMMUNITY): Payer: Self-pay

## 2023-08-12 DIAGNOSIS — K621 Rectal polyp: Secondary | ICD-10-CM | POA: Diagnosis not present

## 2023-08-12 DIAGNOSIS — K219 Gastro-esophageal reflux disease without esophagitis: Secondary | ICD-10-CM | POA: Diagnosis not present

## 2023-08-12 DIAGNOSIS — Z09 Encounter for follow-up examination after completed treatment for conditions other than malignant neoplasm: Secondary | ICD-10-CM | POA: Diagnosis not present

## 2023-08-12 DIAGNOSIS — K2930 Chronic superficial gastritis without bleeding: Secondary | ICD-10-CM | POA: Diagnosis not present

## 2023-08-12 DIAGNOSIS — B3781 Candidal esophagitis: Secondary | ICD-10-CM | POA: Diagnosis not present

## 2023-08-12 DIAGNOSIS — R1310 Dysphagia, unspecified: Secondary | ICD-10-CM | POA: Diagnosis not present

## 2023-08-12 DIAGNOSIS — Z860101 Personal history of adenomatous and serrated colon polyps: Secondary | ICD-10-CM | POA: Diagnosis not present

## 2023-08-12 DIAGNOSIS — D123 Benign neoplasm of transverse colon: Secondary | ICD-10-CM | POA: Diagnosis not present

## 2023-08-12 DIAGNOSIS — D120 Benign neoplasm of cecum: Secondary | ICD-10-CM | POA: Diagnosis not present

## 2023-08-23 DIAGNOSIS — E291 Testicular hypofunction: Secondary | ICD-10-CM | POA: Diagnosis not present

## 2023-08-25 ENCOUNTER — Other Ambulatory Visit (HOSPITAL_COMMUNITY): Payer: Self-pay

## 2023-08-25 MED ORDER — AMOXICILLIN 500 MG PO CAPS
500.0000 mg | ORAL_CAPSULE | Freq: Three times a day (TID) | ORAL | 0 refills | Status: AC
  Filled 2023-08-25: qty 21, 7d supply, fill #0

## 2023-08-25 MED ORDER — SODIUM FLUORIDE 5000 PPM 1.1 % DT PSTE
PASTE | Freq: Two times a day (BID) | DENTAL | 0 refills | Status: AC
  Filled 2023-08-25: qty 100, 30d supply, fill #0

## 2023-09-04 ENCOUNTER — Other Ambulatory Visit (HOSPITAL_COMMUNITY): Payer: Self-pay

## 2023-09-06 ENCOUNTER — Other Ambulatory Visit: Payer: Self-pay

## 2023-09-06 ENCOUNTER — Other Ambulatory Visit (HOSPITAL_COMMUNITY): Payer: Self-pay

## 2023-09-06 MED ORDER — AMPHETAMINE-DEXTROAMPHET ER 20 MG PO CP24
40.0000 mg | ORAL_CAPSULE | Freq: Every morning | ORAL | 0 refills | Status: DC
  Filled 2023-09-06: qty 60, 30d supply, fill #0

## 2023-09-07 ENCOUNTER — Other Ambulatory Visit (HOSPITAL_COMMUNITY): Payer: Self-pay

## 2023-09-07 ENCOUNTER — Encounter (HOSPITAL_COMMUNITY): Payer: Self-pay

## 2023-09-22 DIAGNOSIS — E291 Testicular hypofunction: Secondary | ICD-10-CM | POA: Diagnosis not present

## 2023-09-23 ENCOUNTER — Other Ambulatory Visit (HOSPITAL_COMMUNITY): Payer: Self-pay

## 2023-09-23 MED ORDER — AMPHETAMINE-DEXTROAMPHET ER 20 MG PO CP24
40.0000 mg | ORAL_CAPSULE | Freq: Every morning | ORAL | 0 refills | Status: AC
  Filled 2023-10-05 (×2): qty 60, 30d supply, fill #0
  Filled ????-??-??: fill #0

## 2023-10-04 ENCOUNTER — Other Ambulatory Visit (HOSPITAL_COMMUNITY): Payer: Self-pay

## 2023-10-04 ENCOUNTER — Other Ambulatory Visit: Payer: Self-pay

## 2023-10-05 ENCOUNTER — Other Ambulatory Visit (HOSPITAL_COMMUNITY): Payer: Self-pay

## 2023-10-06 ENCOUNTER — Other Ambulatory Visit (HOSPITAL_COMMUNITY): Payer: Self-pay

## 2023-10-06 ENCOUNTER — Encounter (HOSPITAL_COMMUNITY): Payer: Self-pay

## 2023-10-06 MED ORDER — JARDIANCE 25 MG PO TABS
25.0000 mg | ORAL_TABLET | Freq: Every day | ORAL | 1 refills | Status: DC
  Filled 2023-10-06 – 2023-10-19 (×2): qty 30, 30d supply, fill #0
  Filled 2023-11-10 – 2023-11-13 (×2): qty 30, 30d supply, fill #1

## 2023-10-06 MED ORDER — FENOFIBRATE 160 MG PO TABS
160.0000 mg | ORAL_TABLET | Freq: Every day | ORAL | 1 refills | Status: DC
  Filled 2023-10-12: qty 30, 30d supply, fill #0
  Filled 2023-10-31 – 2023-11-10 (×2): qty 30, 30d supply, fill #1

## 2023-10-12 ENCOUNTER — Other Ambulatory Visit (HOSPITAL_COMMUNITY): Payer: Self-pay

## 2023-10-18 ENCOUNTER — Other Ambulatory Visit (HOSPITAL_COMMUNITY): Payer: Self-pay

## 2023-10-19 ENCOUNTER — Other Ambulatory Visit (HOSPITAL_COMMUNITY): Payer: Self-pay

## 2023-10-25 DIAGNOSIS — E291 Testicular hypofunction: Secondary | ICD-10-CM | POA: Diagnosis not present

## 2023-10-31 ENCOUNTER — Other Ambulatory Visit (HOSPITAL_COMMUNITY): Payer: Self-pay

## 2023-11-01 ENCOUNTER — Other Ambulatory Visit: Payer: Self-pay

## 2023-11-01 ENCOUNTER — Other Ambulatory Visit (HOSPITAL_COMMUNITY): Payer: Self-pay

## 2023-11-03 ENCOUNTER — Other Ambulatory Visit (HOSPITAL_COMMUNITY): Payer: Self-pay

## 2023-11-03 MED ORDER — OMEGA-3-ACID ETHYL ESTERS 1 G PO CAPS
2.0000 | ORAL_CAPSULE | Freq: Two times a day (BID) | ORAL | 0 refills | Status: DC
  Filled 2023-11-03: qty 120, 30d supply, fill #0

## 2023-11-05 ENCOUNTER — Other Ambulatory Visit (HOSPITAL_COMMUNITY): Payer: Self-pay

## 2023-11-09 ENCOUNTER — Other Ambulatory Visit (HOSPITAL_COMMUNITY): Payer: Self-pay

## 2023-11-10 ENCOUNTER — Other Ambulatory Visit (HOSPITAL_COMMUNITY): Payer: Self-pay

## 2023-11-10 ENCOUNTER — Other Ambulatory Visit: Payer: Self-pay

## 2023-11-10 MED ORDER — AMPHETAMINE-DEXTROAMPHET ER 20 MG PO CP24
40.0000 mg | ORAL_CAPSULE | Freq: Every morning | ORAL | 0 refills | Status: DC
  Filled 2023-11-10: qty 60, 30d supply, fill #0

## 2023-11-11 ENCOUNTER — Encounter (HOSPITAL_COMMUNITY): Payer: Self-pay

## 2023-11-11 ENCOUNTER — Other Ambulatory Visit (HOSPITAL_COMMUNITY): Payer: Self-pay

## 2023-11-16 ENCOUNTER — Other Ambulatory Visit (HOSPITAL_COMMUNITY): Payer: Self-pay

## 2023-11-16 DIAGNOSIS — N5201 Erectile dysfunction due to arterial insufficiency: Secondary | ICD-10-CM | POA: Diagnosis not present

## 2023-11-16 DIAGNOSIS — R300 Dysuria: Secondary | ICD-10-CM | POA: Diagnosis not present

## 2023-11-16 DIAGNOSIS — E782 Mixed hyperlipidemia: Secondary | ICD-10-CM | POA: Diagnosis not present

## 2023-11-16 DIAGNOSIS — F900 Attention-deficit hyperactivity disorder, predominantly inattentive type: Secondary | ICD-10-CM | POA: Diagnosis not present

## 2023-11-16 DIAGNOSIS — E119 Type 2 diabetes mellitus without complications: Secondary | ICD-10-CM | POA: Diagnosis not present

## 2023-11-16 DIAGNOSIS — K219 Gastro-esophageal reflux disease without esophagitis: Secondary | ICD-10-CM | POA: Diagnosis not present

## 2023-11-16 DIAGNOSIS — E291 Testicular hypofunction: Secondary | ICD-10-CM | POA: Diagnosis not present

## 2023-11-16 DIAGNOSIS — Z125 Encounter for screening for malignant neoplasm of prostate: Secondary | ICD-10-CM | POA: Diagnosis not present

## 2023-11-16 MED ORDER — JARDIANCE 25 MG PO TABS
25.0000 mg | ORAL_TABLET | Freq: Every day | ORAL | 5 refills | Status: AC
  Filled 2023-11-16 – 2023-12-10 (×4): qty 30, 30d supply, fill #0
  Filled 2024-01-10: qty 30, 30d supply, fill #1
  Filled 2024-02-06: qty 30, 30d supply, fill #2
  Filled 2024-03-07: qty 30, 30d supply, fill #3
  Filled 2024-04-06: qty 30, 30d supply, fill #4
  Filled 2024-05-10 – 2024-05-31 (×2): qty 30, 30d supply, fill #5
  Filled 2024-07-11 – 2024-08-07 (×2): qty 30, 30d supply, fill #0

## 2023-11-16 MED ORDER — AMPHETAMINE-DEXTROAMPHET ER 20 MG PO CP24
40.0000 mg | ORAL_CAPSULE | Freq: Every morning | ORAL | 0 refills | Status: AC
  Filled 2023-11-16: qty 60, 60d supply, fill #0
  Filled 2024-01-10 – 2024-01-11 (×2): qty 60, 30d supply, fill #0

## 2023-11-16 MED ORDER — TADALAFIL 20 MG PO TABS
20.0000 mg | ORAL_TABLET | Freq: Every day | ORAL | 0 refills | Status: DC
Start: 2023-11-16 — End: 2024-05-11
  Filled 2023-11-16: qty 18, 90d supply, fill #0
  Filled 2024-04-06: qty 12, 60d supply, fill #1

## 2023-11-16 MED ORDER — METFORMIN HCL ER 500 MG PO TB24
2000.0000 mg | ORAL_TABLET | Freq: Every day | ORAL | 3 refills | Status: AC
  Filled 2023-11-16: qty 360, 360d supply, fill #0
  Filled 2023-11-29 (×2): qty 360, 90d supply, fill #0
  Filled 2024-02-06 – 2024-02-14 (×2): qty 360, 90d supply, fill #1
  Filled 2024-03-07 – 2024-05-10 (×2): qty 360, 90d supply, fill #2

## 2023-11-16 MED ORDER — AMPHETAMINE-DEXTROAMPHET ER 20 MG PO CP24
20.0000 mg | ORAL_CAPSULE | Freq: Every morning | ORAL | 0 refills | Status: AC
  Filled 2023-11-16: qty 60, 60d supply, fill #0
  Filled 2023-12-10: qty 60, 30d supply, fill #0

## 2023-11-16 MED ORDER — AMPHETAMINE-DEXTROAMPHET ER 20 MG PO CP24
20.0000 mg | ORAL_CAPSULE | Freq: Every morning | ORAL | 0 refills | Status: AC
  Filled 2023-11-16: qty 60, 60d supply, fill #0
  Filled 2024-02-10: qty 60, 30d supply, fill #0

## 2023-11-16 MED ORDER — ROSUVASTATIN CALCIUM 10 MG PO TABS
10.0000 mg | ORAL_TABLET | Freq: Every day | ORAL | 3 refills | Status: AC
  Filled 2023-11-16 – 2024-01-26 (×6): qty 90, 90d supply, fill #0
  Filled 2024-03-07 – 2024-04-07 (×3): qty 90, 90d supply, fill #1
  Filled 2024-05-31: qty 90, 90d supply, fill #2
  Filled 2024-07-11: qty 90, 90d supply, fill #0
  Filled 2024-08-07: qty 90, 90d supply, fill #1

## 2023-11-18 ENCOUNTER — Other Ambulatory Visit (HOSPITAL_COMMUNITY): Payer: Self-pay

## 2023-11-19 ENCOUNTER — Other Ambulatory Visit (HOSPITAL_COMMUNITY): Payer: Self-pay

## 2023-11-19 MED ORDER — MOUNJARO 5 MG/0.5ML ~~LOC~~ SOAJ
5.0000 mg | SUBCUTANEOUS | 3 refills | Status: DC
  Filled 2023-11-19: qty 2, 28d supply, fill #0

## 2023-11-22 ENCOUNTER — Other Ambulatory Visit (HOSPITAL_COMMUNITY): Payer: Self-pay

## 2023-11-22 MED ORDER — DOXYCYCLINE HYCLATE 100 MG PO TABS
100.0000 mg | ORAL_TABLET | Freq: Two times a day (BID) | ORAL | 0 refills | Status: AC
  Filled 2023-11-22: qty 28, 14d supply, fill #0

## 2023-11-23 ENCOUNTER — Other Ambulatory Visit (HOSPITAL_COMMUNITY): Payer: Self-pay

## 2023-11-23 MED ORDER — SULFAMETHOXAZOLE-TRIMETHOPRIM 800-160 MG PO TABS
1.0000 | ORAL_TABLET | Freq: Two times a day (BID) | ORAL | 0 refills | Status: AC
  Filled 2023-11-23: qty 28, 14d supply, fill #0

## 2023-11-29 ENCOUNTER — Other Ambulatory Visit (HOSPITAL_COMMUNITY): Payer: Self-pay

## 2023-11-29 ENCOUNTER — Other Ambulatory Visit: Payer: Self-pay

## 2023-11-30 DIAGNOSIS — E291 Testicular hypofunction: Secondary | ICD-10-CM | POA: Diagnosis not present

## 2023-12-01 ENCOUNTER — Other Ambulatory Visit (HOSPITAL_COMMUNITY): Payer: Self-pay

## 2023-12-01 MED ORDER — OMEGA-3-ACID ETHYL ESTERS 1 G PO CAPS
2.0000 | ORAL_CAPSULE | Freq: Two times a day (BID) | ORAL | 0 refills | Status: DC
  Filled 2023-12-01: qty 120, 30d supply, fill #0

## 2023-12-10 ENCOUNTER — Other Ambulatory Visit (HOSPITAL_COMMUNITY): Payer: Self-pay

## 2023-12-10 ENCOUNTER — Other Ambulatory Visit: Payer: Self-pay

## 2023-12-10 MED ORDER — AMPHETAMINE-DEXTROAMPHET ER 20 MG PO CP24
40.0000 mg | ORAL_CAPSULE | Freq: Every morning | ORAL | 0 refills | Status: DC
Start: 2024-01-09 — End: 2024-04-11
  Filled 2024-03-13: qty 60, 30d supply, fill #0

## 2023-12-27 ENCOUNTER — Other Ambulatory Visit (HOSPITAL_COMMUNITY): Payer: Self-pay

## 2023-12-27 DIAGNOSIS — E291 Testicular hypofunction: Secondary | ICD-10-CM | POA: Diagnosis not present

## 2023-12-27 MED ORDER — MOUNJARO 7.5 MG/0.5ML ~~LOC~~ SOAJ
7.5000 mg | SUBCUTANEOUS | 3 refills | Status: DC
  Filled 2023-12-27: qty 2, 28d supply, fill #0
  Filled 2024-01-26: qty 2, 28d supply, fill #1

## 2024-01-10 ENCOUNTER — Other Ambulatory Visit: Payer: Self-pay

## 2024-01-10 ENCOUNTER — Other Ambulatory Visit (HOSPITAL_COMMUNITY): Payer: Self-pay

## 2024-01-11 ENCOUNTER — Other Ambulatory Visit: Payer: Self-pay

## 2024-01-11 ENCOUNTER — Other Ambulatory Visit (HOSPITAL_COMMUNITY): Payer: Self-pay

## 2024-01-11 MED ORDER — FENOFIBRATE 160 MG PO TABS
160.0000 mg | ORAL_TABLET | Freq: Every day | ORAL | 1 refills | Status: DC
  Filled 2024-01-11: qty 30, 30d supply, fill #0
  Filled 2024-02-10: qty 30, 30d supply, fill #1

## 2024-01-11 MED ORDER — OMEGA-3-ACID ETHYL ESTERS 1 G PO CAPS
2.0000 | ORAL_CAPSULE | Freq: Two times a day (BID) | ORAL | 0 refills | Status: DC
  Filled 2024-01-11: qty 120, 30d supply, fill #0

## 2024-01-26 ENCOUNTER — Other Ambulatory Visit (HOSPITAL_COMMUNITY): Payer: Self-pay

## 2024-02-06 ENCOUNTER — Other Ambulatory Visit (HOSPITAL_COMMUNITY): Payer: Self-pay

## 2024-02-07 ENCOUNTER — Other Ambulatory Visit (HOSPITAL_COMMUNITY): Payer: Self-pay

## 2024-02-07 ENCOUNTER — Other Ambulatory Visit: Payer: Self-pay

## 2024-02-07 MED ORDER — MOUNJARO 10 MG/0.5ML ~~LOC~~ SOAJ
10.0000 mg | SUBCUTANEOUS | 3 refills | Status: DC
  Filled 2024-02-07 – 2024-02-18 (×2): qty 2, 28d supply, fill #0

## 2024-02-08 ENCOUNTER — Other Ambulatory Visit (HOSPITAL_COMMUNITY): Payer: Self-pay

## 2024-02-08 MED ORDER — OMEGA-3-ACID ETHYL ESTERS 1 G PO CAPS
2.0000 | ORAL_CAPSULE | Freq: Two times a day (BID) | ORAL | 0 refills | Status: DC
  Filled 2024-02-08: qty 120, 30d supply, fill #0

## 2024-02-10 ENCOUNTER — Other Ambulatory Visit (HOSPITAL_COMMUNITY): Payer: Self-pay

## 2024-02-16 ENCOUNTER — Other Ambulatory Visit (HOSPITAL_COMMUNITY): Payer: Self-pay

## 2024-02-16 DIAGNOSIS — E119 Type 2 diabetes mellitus without complications: Secondary | ICD-10-CM | POA: Diagnosis not present

## 2024-02-16 DIAGNOSIS — E782 Mixed hyperlipidemia: Secondary | ICD-10-CM | POA: Diagnosis not present

## 2024-02-16 DIAGNOSIS — N5201 Erectile dysfunction due to arterial insufficiency: Secondary | ICD-10-CM | POA: Diagnosis not present

## 2024-02-16 DIAGNOSIS — K219 Gastro-esophageal reflux disease without esophagitis: Secondary | ICD-10-CM | POA: Diagnosis not present

## 2024-02-16 DIAGNOSIS — Z125 Encounter for screening for malignant neoplasm of prostate: Secondary | ICD-10-CM | POA: Diagnosis not present

## 2024-02-16 DIAGNOSIS — E291 Testicular hypofunction: Secondary | ICD-10-CM | POA: Diagnosis not present

## 2024-02-16 DIAGNOSIS — F900 Attention-deficit hyperactivity disorder, predominantly inattentive type: Secondary | ICD-10-CM | POA: Diagnosis not present

## 2024-02-18 ENCOUNTER — Other Ambulatory Visit: Payer: Self-pay

## 2024-02-18 ENCOUNTER — Other Ambulatory Visit (HOSPITAL_COMMUNITY): Payer: Self-pay

## 2024-02-20 ENCOUNTER — Other Ambulatory Visit (HOSPITAL_COMMUNITY): Payer: Self-pay

## 2024-02-21 ENCOUNTER — Other Ambulatory Visit (HOSPITAL_COMMUNITY): Payer: Self-pay

## 2024-02-21 MED ORDER — OMEGA-3-ACID ETHYL ESTERS 1 G PO CAPS
2.0000 | ORAL_CAPSULE | Freq: Two times a day (BID) | ORAL | 0 refills | Status: DC
  Filled 2024-03-13: qty 120, 30d supply, fill #0

## 2024-02-21 MED ORDER — MOUNJARO 12.5 MG/0.5ML ~~LOC~~ SOAJ
12.5000 mg | SUBCUTANEOUS | 5 refills | Status: AC
  Filled 2024-02-21 – 2024-03-15 (×3): qty 2, 28d supply, fill #0
  Filled 2024-04-09: qty 2, 28d supply, fill #1
  Filled 2024-05-10: qty 2, 28d supply, fill #2
  Filled 2024-05-31 – 2024-06-05 (×2): qty 2, 28d supply, fill #3
  Filled 2024-07-06: qty 2, 28d supply, fill #4
  Filled 2024-07-11: qty 2, 28d supply, fill #0
  Filled 2024-08-07: qty 2, 28d supply, fill #1

## 2024-03-07 ENCOUNTER — Other Ambulatory Visit: Payer: Self-pay

## 2024-03-07 ENCOUNTER — Other Ambulatory Visit (HOSPITAL_COMMUNITY): Payer: Self-pay

## 2024-03-08 ENCOUNTER — Other Ambulatory Visit: Payer: Self-pay

## 2024-03-08 ENCOUNTER — Other Ambulatory Visit (HOSPITAL_COMMUNITY): Payer: Self-pay

## 2024-03-08 MED ORDER — FENOFIBRATE 160 MG PO TABS
160.0000 mg | ORAL_TABLET | Freq: Every day | ORAL | 0 refills | Status: DC
  Filled 2024-03-08: qty 30, 30d supply, fill #0

## 2024-03-08 MED ORDER — JARDIANCE 25 MG PO TABS
25.0000 mg | ORAL_TABLET | Freq: Every day | ORAL | 0 refills | Status: AC
  Filled 2024-03-08 – 2024-05-10 (×3): qty 30, 30d supply, fill #0

## 2024-03-13 ENCOUNTER — Other Ambulatory Visit (HOSPITAL_COMMUNITY): Payer: Self-pay

## 2024-03-13 ENCOUNTER — Other Ambulatory Visit (HOSPITAL_BASED_OUTPATIENT_CLINIC_OR_DEPARTMENT_OTHER): Payer: Self-pay

## 2024-03-14 ENCOUNTER — Other Ambulatory Visit (HOSPITAL_COMMUNITY): Payer: Self-pay

## 2024-03-15 ENCOUNTER — Other Ambulatory Visit (HOSPITAL_COMMUNITY): Payer: Self-pay

## 2024-03-24 DIAGNOSIS — E291 Testicular hypofunction: Secondary | ICD-10-CM | POA: Diagnosis not present

## 2024-04-06 ENCOUNTER — Other Ambulatory Visit (HOSPITAL_COMMUNITY): Payer: Self-pay

## 2024-04-06 ENCOUNTER — Other Ambulatory Visit: Payer: Self-pay

## 2024-04-06 MED ORDER — FENOFIBRATE 160 MG PO TABS
160.0000 mg | ORAL_TABLET | Freq: Every day | ORAL | 0 refills | Status: DC
  Filled 2024-04-06: qty 30, 30d supply, fill #0

## 2024-04-06 MED ORDER — OMEGA-3-ACID ETHYL ESTERS 1 G PO CAPS
2.0000 | ORAL_CAPSULE | Freq: Two times a day (BID) | ORAL | 0 refills | Status: DC
  Filled 2024-04-06: qty 120, 30d supply, fill #0

## 2024-04-09 ENCOUNTER — Other Ambulatory Visit (HOSPITAL_COMMUNITY): Payer: Self-pay

## 2024-04-10 ENCOUNTER — Other Ambulatory Visit (HOSPITAL_COMMUNITY): Payer: Self-pay

## 2024-04-10 ENCOUNTER — Other Ambulatory Visit: Payer: Self-pay

## 2024-04-10 DIAGNOSIS — M79675 Pain in left toe(s): Secondary | ICD-10-CM | POA: Diagnosis not present

## 2024-04-10 MED ORDER — HYDROCORTISONE 1 % EX OINT
1.0000 | TOPICAL_OINTMENT | Freq: Every day | CUTANEOUS | 0 refills | Status: AC
  Filled 2024-04-10: qty 28.4, 18d supply, fill #0

## 2024-04-10 MED ORDER — MUPIROCIN 2 % EX OINT
1.0000 | TOPICAL_OINTMENT | Freq: Two times a day (BID) | CUTANEOUS | 0 refills | Status: AC
  Filled 2024-04-10: qty 22, 11d supply, fill #0

## 2024-04-11 ENCOUNTER — Other Ambulatory Visit (HOSPITAL_COMMUNITY): Payer: Self-pay

## 2024-04-11 MED ORDER — AMPHETAMINE-DEXTROAMPHET ER 20 MG PO CP24
40.0000 mg | ORAL_CAPSULE | Freq: Every morning | ORAL | 0 refills | Status: DC
  Filled 2024-04-11: qty 60, 30d supply, fill #0

## 2024-05-03 DIAGNOSIS — N5201 Erectile dysfunction due to arterial insufficiency: Secondary | ICD-10-CM | POA: Diagnosis not present

## 2024-05-03 DIAGNOSIS — E782 Mixed hyperlipidemia: Secondary | ICD-10-CM | POA: Diagnosis not present

## 2024-05-03 DIAGNOSIS — K219 Gastro-esophageal reflux disease without esophagitis: Secondary | ICD-10-CM | POA: Diagnosis not present

## 2024-05-03 DIAGNOSIS — E119 Type 2 diabetes mellitus without complications: Secondary | ICD-10-CM | POA: Diagnosis not present

## 2024-05-03 DIAGNOSIS — F900 Attention-deficit hyperactivity disorder, predominantly inattentive type: Secondary | ICD-10-CM | POA: Diagnosis not present

## 2024-05-03 DIAGNOSIS — E291 Testicular hypofunction: Secondary | ICD-10-CM | POA: Diagnosis not present

## 2024-05-10 ENCOUNTER — Other Ambulatory Visit (HOSPITAL_COMMUNITY): Payer: Self-pay

## 2024-05-10 ENCOUNTER — Other Ambulatory Visit: Payer: Self-pay

## 2024-05-11 ENCOUNTER — Other Ambulatory Visit: Payer: Self-pay

## 2024-05-11 ENCOUNTER — Other Ambulatory Visit (HOSPITAL_COMMUNITY): Payer: Self-pay

## 2024-05-11 MED ORDER — METFORMIN HCL ER 500 MG PO TB24
2000.0000 mg | ORAL_TABLET | Freq: Every day | ORAL | 0 refills | Status: AC
  Filled 2024-05-11 – 2024-08-07 (×3): qty 360, 90d supply, fill #0

## 2024-05-11 MED ORDER — JARDIANCE 25 MG PO TABS
25.0000 mg | ORAL_TABLET | Freq: Every day | ORAL | 0 refills | Status: DC
  Filled 2024-05-11 – 2024-06-05 (×3): qty 90, 90d supply, fill #0

## 2024-05-11 MED ORDER — TADALAFIL 20 MG PO TABS
20.0000 mg | ORAL_TABLET | Freq: Every day | ORAL | 0 refills | Status: AC | PRN
  Filled 2024-05-11 – 2024-05-14 (×2): qty 30, 30d supply, fill #0
  Filled 2024-05-24: qty 12, 60d supply, fill #0
  Filled 2024-07-06 – 2024-08-07 (×2): qty 30, 30d supply, fill #0

## 2024-05-11 MED ORDER — AMPHETAMINE-DEXTROAMPHET ER 20 MG PO CP24
40.0000 mg | ORAL_CAPSULE | Freq: Every morning | ORAL | 0 refills | Status: DC
  Filled 2024-06-12: qty 60, 30d supply, fill #0

## 2024-05-11 MED ORDER — FENOFIBRATE 160 MG PO TABS
160.0000 mg | ORAL_TABLET | Freq: Every day | ORAL | 2 refills | Status: DC
  Filled 2024-05-11: qty 30, 30d supply, fill #0
  Filled 2024-06-12: qty 30, 30d supply, fill #1
  Filled 2024-07-06: qty 30, 30d supply, fill #2
  Filled 2024-07-11: qty 30, 30d supply, fill #0

## 2024-05-11 MED ORDER — AMPHETAMINE-DEXTROAMPHET ER 20 MG PO CP24
40.0000 mg | ORAL_CAPSULE | Freq: Every morning | ORAL | 0 refills | Status: AC
  Filled 2024-05-12: qty 60, 30d supply, fill #0

## 2024-05-12 ENCOUNTER — Other Ambulatory Visit (HOSPITAL_COMMUNITY): Payer: Self-pay

## 2024-05-14 ENCOUNTER — Other Ambulatory Visit (HOSPITAL_COMMUNITY): Payer: Self-pay

## 2024-05-15 ENCOUNTER — Other Ambulatory Visit (HOSPITAL_COMMUNITY): Payer: Self-pay

## 2024-05-17 ENCOUNTER — Other Ambulatory Visit (HOSPITAL_COMMUNITY): Payer: Self-pay

## 2024-05-18 ENCOUNTER — Other Ambulatory Visit (HOSPITAL_COMMUNITY): Payer: Self-pay

## 2024-05-18 MED ORDER — OMEGA-3-ACID ETHYL ESTERS 1 G PO CAPS
2.0000 | ORAL_CAPSULE | Freq: Two times a day (BID) | ORAL | 2 refills | Status: AC
  Filled 2024-05-18: qty 120, 30d supply, fill #0
  Filled 2024-05-31 – 2024-07-04 (×2): qty 120, 30d supply, fill #1
  Filled 2024-07-11: qty 120, 30d supply, fill #0
  Filled 2024-08-07: qty 120, 30d supply, fill #1

## 2024-05-22 ENCOUNTER — Other Ambulatory Visit (HOSPITAL_COMMUNITY): Payer: Self-pay

## 2024-05-23 ENCOUNTER — Other Ambulatory Visit (HOSPITAL_COMMUNITY): Payer: Self-pay

## 2024-05-24 ENCOUNTER — Other Ambulatory Visit (HOSPITAL_COMMUNITY): Payer: Self-pay

## 2024-05-31 ENCOUNTER — Other Ambulatory Visit (HOSPITAL_COMMUNITY): Payer: Self-pay

## 2024-06-05 ENCOUNTER — Other Ambulatory Visit (HOSPITAL_COMMUNITY): Payer: Self-pay

## 2024-06-09 ENCOUNTER — Other Ambulatory Visit (HOSPITAL_COMMUNITY): Payer: Self-pay

## 2024-06-12 ENCOUNTER — Other Ambulatory Visit (HOSPITAL_COMMUNITY): Payer: Self-pay

## 2024-07-06 ENCOUNTER — Other Ambulatory Visit (HOSPITAL_COMMUNITY): Payer: Self-pay

## 2024-07-06 ENCOUNTER — Other Ambulatory Visit: Payer: Self-pay

## 2024-07-06 ENCOUNTER — Encounter: Payer: Self-pay | Admitting: Pharmacist

## 2024-07-11 ENCOUNTER — Other Ambulatory Visit: Payer: Self-pay

## 2024-07-11 ENCOUNTER — Other Ambulatory Visit (HOSPITAL_COMMUNITY): Payer: Self-pay

## 2024-07-12 ENCOUNTER — Other Ambulatory Visit (HOSPITAL_COMMUNITY): Payer: Self-pay

## 2024-07-12 MED ORDER — AMPHETAMINE-DEXTROAMPHET ER 20 MG PO CP24
40.0000 mg | ORAL_CAPSULE | Freq: Every morning | ORAL | 0 refills | Status: DC
  Filled 2024-07-12: qty 60, 30d supply, fill #0

## 2024-07-14 ENCOUNTER — Other Ambulatory Visit (HOSPITAL_COMMUNITY): Payer: Self-pay

## 2024-07-14 MED ORDER — JARDIANCE 25 MG PO TABS
25.0000 mg | ORAL_TABLET | Freq: Every day | ORAL | 0 refills | Status: AC
  Filled 2024-07-14: qty 90, 90d supply, fill #0

## 2024-07-17 ENCOUNTER — Other Ambulatory Visit (HOSPITAL_COMMUNITY): Payer: Self-pay

## 2024-08-07 ENCOUNTER — Other Ambulatory Visit (HOSPITAL_COMMUNITY): Payer: Self-pay

## 2024-08-07 ENCOUNTER — Other Ambulatory Visit: Payer: Self-pay

## 2024-08-08 ENCOUNTER — Other Ambulatory Visit: Payer: Self-pay

## 2024-08-08 ENCOUNTER — Other Ambulatory Visit (HOSPITAL_COMMUNITY): Payer: Self-pay

## 2024-08-08 MED ORDER — FENOFIBRATE 160 MG PO TABS
160.0000 mg | ORAL_TABLET | Freq: Every day | ORAL | 0 refills | Status: AC
  Filled 2024-08-08: qty 30, 30d supply, fill #0

## 2024-08-16 ENCOUNTER — Other Ambulatory Visit: Payer: Self-pay

## 2024-08-16 ENCOUNTER — Other Ambulatory Visit (HOSPITAL_COMMUNITY): Payer: Self-pay

## 2024-08-16 MED ORDER — AMPHETAMINE-DEXTROAMPHET ER 20 MG PO CP24
40.0000 mg | ORAL_CAPSULE | Freq: Every morning | ORAL | 0 refills | Status: AC
  Filled 2024-08-16: qty 60, 30d supply, fill #0
